# Patient Record
Sex: Female | Born: 1955 | Race: White | Hispanic: No | Marital: Married | Smoking: Never smoker
Health system: Southern US, Community
[De-identification: ages and names within clinical notes are randomized; demographics above are authoritative.]

## PROBLEM LIST (undated history)

## (undated) DIAGNOSIS — F419 Anxiety disorder, unspecified: Secondary | ICD-10-CM

## (undated) DIAGNOSIS — E039 Hypothyroidism, unspecified: Secondary | ICD-10-CM

## (undated) DIAGNOSIS — E538 Deficiency of other specified B group vitamins: Secondary | ICD-10-CM

## (undated) DIAGNOSIS — M810 Age-related osteoporosis without current pathological fracture: Secondary | ICD-10-CM

## (undated) DIAGNOSIS — Z8719 Personal history of other diseases of the digestive system: Secondary | ICD-10-CM

## (undated) DIAGNOSIS — M797 Fibromyalgia: Secondary | ICD-10-CM

## (undated) HISTORY — DX: Anxiety disorder, unspecified: F41.9

## (undated) HISTORY — DX: Hypothyroidism, unspecified: E03.9

## (undated) HISTORY — PX: CHOLECYSTECTOMY: SHX55

## (undated) HISTORY — DX: Age-related osteoporosis without current pathological fracture: M81.0

## (undated) HISTORY — DX: Personal history of other diseases of the digestive system: Z87.19

## (undated) HISTORY — DX: Fibromyalgia: M79.7

## (undated) HISTORY — PX: ABDOMINAL HYSTERECTOMY: SHX81

## (undated) HISTORY — DX: Deficiency of other specified B group vitamins: E53.8

## (undated) HISTORY — PX: TUBAL LIGATION: SHX77

---

## 2003-07-23 ENCOUNTER — Ambulatory Visit (HOSPITAL_COMMUNITY): Admission: RE | Admit: 2003-07-23 | Discharge: 2003-07-23 | Payer: Self-pay | Admitting: Family Medicine

## 2012-09-27 HISTORY — PX: COLONOSCOPY: SHX174

## 2014-03-22 ENCOUNTER — Other Ambulatory Visit (HOSPITAL_COMMUNITY): Payer: Self-pay | Admitting: "Endocrinology

## 2014-03-22 DIAGNOSIS — E041 Nontoxic single thyroid nodule: Secondary | ICD-10-CM

## 2014-03-25 ENCOUNTER — Ambulatory Visit (HOSPITAL_COMMUNITY)
Admission: RE | Admit: 2014-03-25 | Discharge: 2014-03-25 | Disposition: A | Discharge status: Home / Self Care | Payer: BC Managed Care – PPO | Source: Ambulatory Visit | Attending: "Endocrinology | Admitting: "Endocrinology

## 2014-03-25 DIAGNOSIS — E042 Nontoxic multinodular goiter: Secondary | ICD-10-CM | POA: Insufficient documentation

## 2014-03-25 DIAGNOSIS — E041 Nontoxic single thyroid nodule: Secondary | ICD-10-CM

## 2014-03-27 ENCOUNTER — Other Ambulatory Visit: Payer: Self-pay | Admitting: "Endocrinology

## 2014-03-27 DIAGNOSIS — E041 Nontoxic single thyroid nodule: Secondary | ICD-10-CM

## 2014-04-02 ENCOUNTER — Ambulatory Visit
Admission: RE | Admit: 2014-04-02 | Discharge: 2014-04-02 | Disposition: A | Discharge status: Home / Self Care | Payer: BC Managed Care – PPO | Source: Ambulatory Visit | Attending: "Endocrinology | Admitting: "Endocrinology

## 2014-04-02 ENCOUNTER — Other Ambulatory Visit (HOSPITAL_COMMUNITY)
Admission: RE | Admit: 2014-04-02 | Discharge: 2014-04-02 | Disposition: A | Discharge status: Home / Self Care | Payer: BC Managed Care – PPO | Source: Ambulatory Visit | Attending: Interventional Radiology | Admitting: Interventional Radiology

## 2014-04-02 DIAGNOSIS — E041 Nontoxic single thyroid nodule: Secondary | ICD-10-CM

## 2014-11-11 ENCOUNTER — Other Ambulatory Visit (HOSPITAL_COMMUNITY): Payer: Self-pay | Admitting: "Endocrinology

## 2014-11-11 DIAGNOSIS — E049 Nontoxic goiter, unspecified: Secondary | ICD-10-CM

## 2014-12-27 ENCOUNTER — Ambulatory Visit (HOSPITAL_COMMUNITY)
Admission: RE | Admit: 2014-12-27 | Discharge: 2014-12-27 | Disposition: A | Discharge status: Home / Self Care | Payer: BLUE CROSS/BLUE SHIELD | Source: Ambulatory Visit | Attending: "Endocrinology | Admitting: "Endocrinology

## 2014-12-27 DIAGNOSIS — E042 Nontoxic multinodular goiter: Secondary | ICD-10-CM | POA: Insufficient documentation

## 2014-12-27 DIAGNOSIS — E049 Nontoxic goiter, unspecified: Secondary | ICD-10-CM | POA: Diagnosis present

## 2015-07-09 ENCOUNTER — Encounter: Payer: Self-pay | Admitting: "Endocrinology

## 2015-07-09 ENCOUNTER — Ambulatory Visit (INDEPENDENT_AMBULATORY_CARE_PROVIDER_SITE_OTHER): Payer: BLUE CROSS/BLUE SHIELD | Admitting: "Endocrinology

## 2015-07-09 VITALS — BP 134/85 | HR 75 | Ht 63.0 in | Wt 142.0 lb

## 2015-07-09 DIAGNOSIS — E039 Hypothyroidism, unspecified: Secondary | ICD-10-CM

## 2015-07-09 DIAGNOSIS — E042 Nontoxic multinodular goiter: Secondary | ICD-10-CM | POA: Diagnosis not present

## 2015-07-09 MED ORDER — LEVOTHYROXINE SODIUM 100 MCG PO TABS: 100.0000 ug | ORAL_TABLET | Freq: Every day | ORAL | Status: AC

## 2015-07-09 NOTE — Progress Notes (Signed)
HPI  Emily Hood is a 59 y.o.-year-old female, Presenting for f/u of  Hypothyroidism and MNG.  Pt. has been dx with hypothyroidism is on Levothyroxine 100 mcg, taken: - fasting - with water - separated by >30 min from b'fast  - no calcium, iron, PPIs, multivitamins   I reviewed pt's thyroid tests: From 06/27/2015 free T4 1 0.6 TSH 0.7.  Pt lost 6 lbs, denies cold  intolerance, constipation, dry skin.  Pt denies  hoarseness, dysphagia/odynophagia, SOB with lying down.   ROS: Constitutional: no weight gain/loss, no fatigue, no subjective hyperthermia/hypothermia Eyes: no blurry vision, no xerophthalmia ENT: no sore throat, no nodules palpated in throat, no dysphagia/odynophagia, no hoarseness Cardiovascular: no CP/SOB/palpitations/leg swelling Respiratory: no cough/SOB Gastrointestinal: no N/V/D/C Musculoskeletal: no muscle/joint aches Skin: no rashes Neurological: no tremors/numbness/tingling/dizziness Psychiatric: no depression/anxiety  PE: BP 134/85 mmHg  Pulse 75  Ht 5\' 3"  (1.6 m)  Wt 142 lb (64.411 kg)  BMI 25.16 kg/m2  SpO2 99% Wt Readings from Last 3 Encounters:  07/09/15 142 lb (64.411 kg)   Constitutional: overweight, in NAD Eyes: PERRLA, EOMI, no exophthalmos ENT: moist mucous membranes, mild  thyromegaly, no cervical lymphadenopathy Cardiovascular: RRR, No MRG Respiratory: CTA B Gastrointestinal: abdomen soft, NT, ND, BS+ Musculoskeletal: no deformities, strength intact in all 4 Skin: moist, warm, no rashes Neurological: no tremor with outstretched hands, DTR normal in all 4  ASSESSMENT: 1. Hypothyroidism 2. MNG  PLAN:    Patient with long-standing hypothyroidism, on levothyroxine therapy. Patient appears euthyroid. I advised her to continue levothyroxine 100 g by mouth every morning.. - We discussed about correct intake of levothyroxine, at fasting, with water, separated by at least 30 minutes from breakfast, and separated by more than 4 hours  from calcium, iron, multivitamins, acid reflux medications (PPIs). -Patient is made aware of the fact that thyroid hormone replacement is needed for life, dose to be adjusted by periodic monitoring of thyroid function tests.  - Will check thyroid tests before next visit: TSH, free T4   On physical exam , patient has  mild goiter.  Her FNA showed benign findings with follicular, Hurthle cells, and lymphocytes. She has biopsy of only the left lobe nodule. she has stable size of nodules. she will not not need surgery , now but may need repeat thyroid u/s in a year after a PE.  Marquis LunchGebre Kishawn Pickar, MD Phone: [phone]  Fax: [phone]   07/09/2015, 8:56 AM

## 2015-08-06 ENCOUNTER — Encounter: Payer: Self-pay | Admitting: "Endocrinology

## 2015-10-31 IMAGING — US US THYROID BIOPSY
1 series · 13 of 13 positions shown · non-contrast
Comparison: Prior thyroid ultrasound 03/25/2014

CLINICAL DATA: 58-year-old female with a dominant left thyroid
nodule which meets consensus criteria for FNA biopsy.

EXAM:
ULTRASOUND GUIDED NEEDLE ASPIRATE BIOPSY OF THE THYROID GLAND

[Series 1: us thyroid biopsy · 0.06mm/px · 13 acquisitions, 13 frames shown]
[im 1/13]
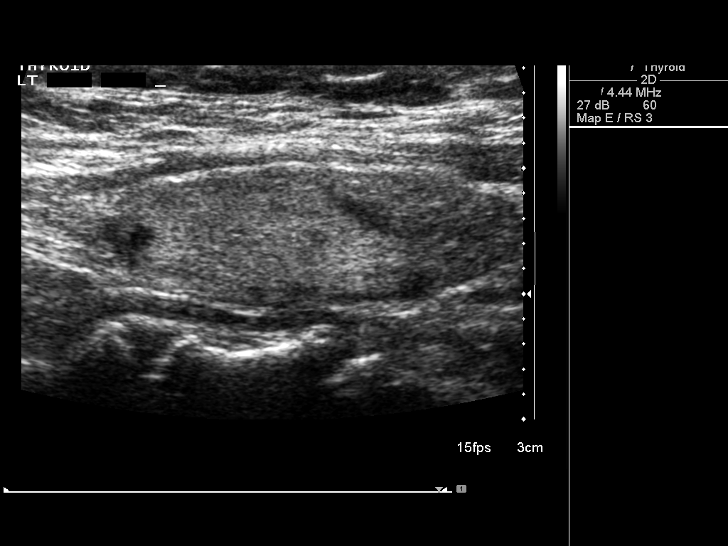
[im 2/13]
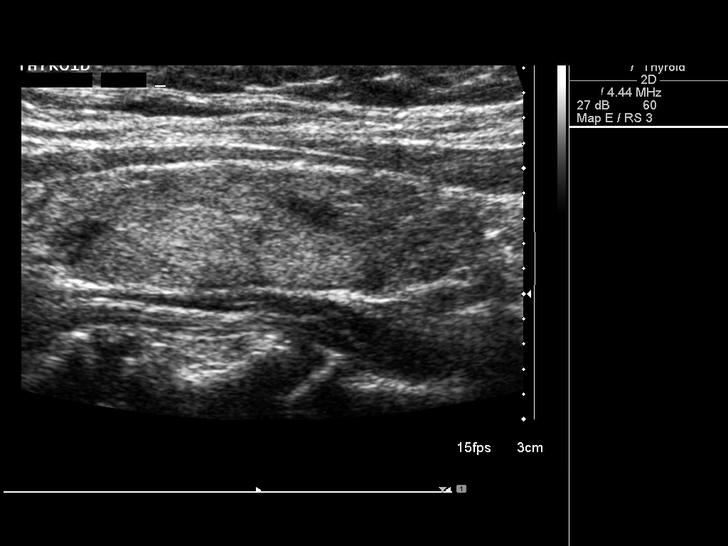
[im 3/13]
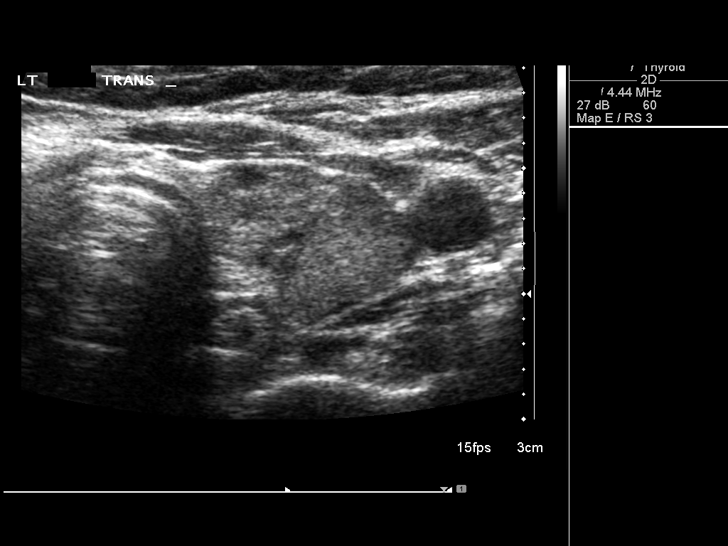
[im 4/13]
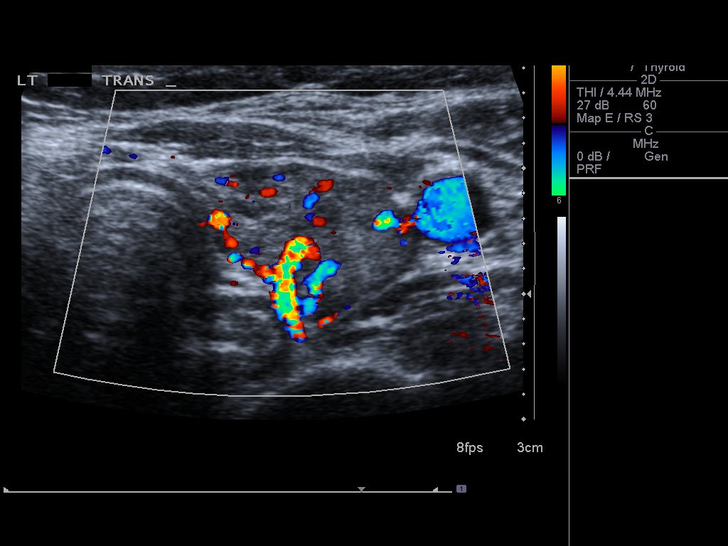
[im 5/13]
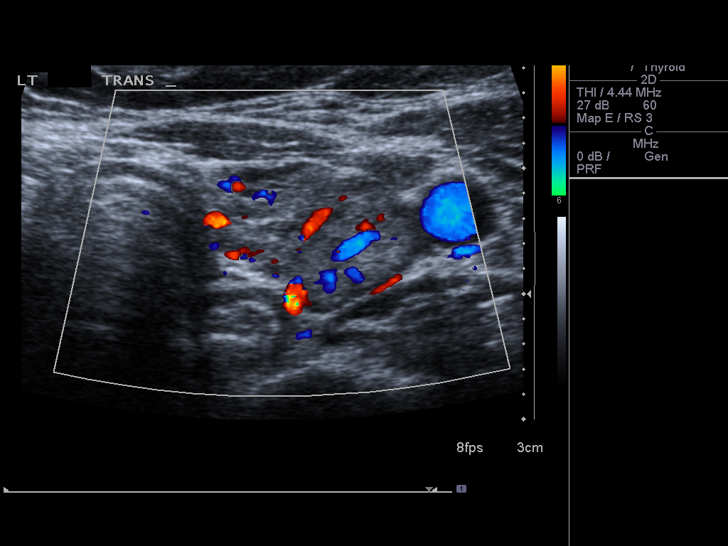
[im 6/13]
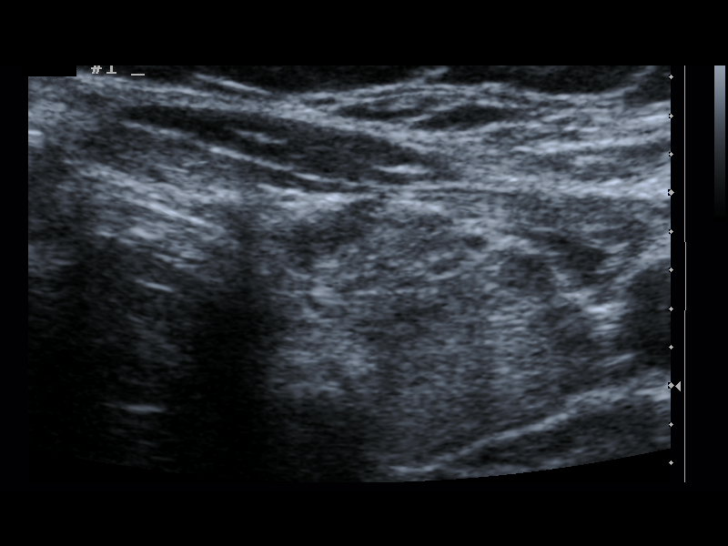
[im 7/13]
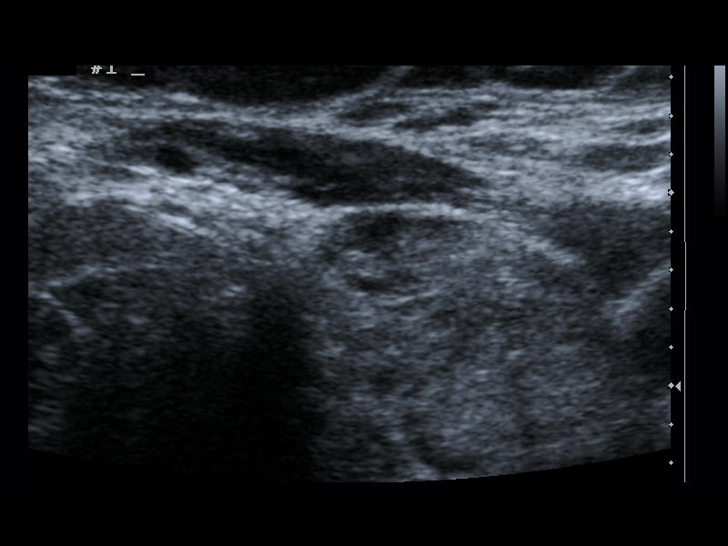
[im 8/13]
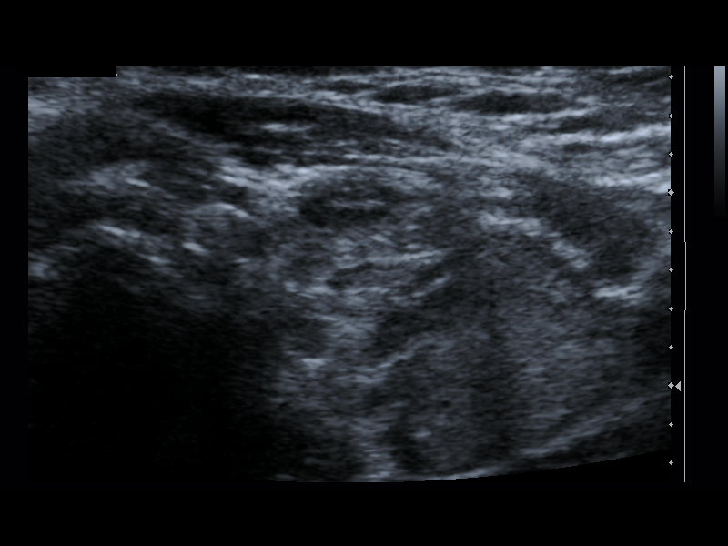
[im 9/13]
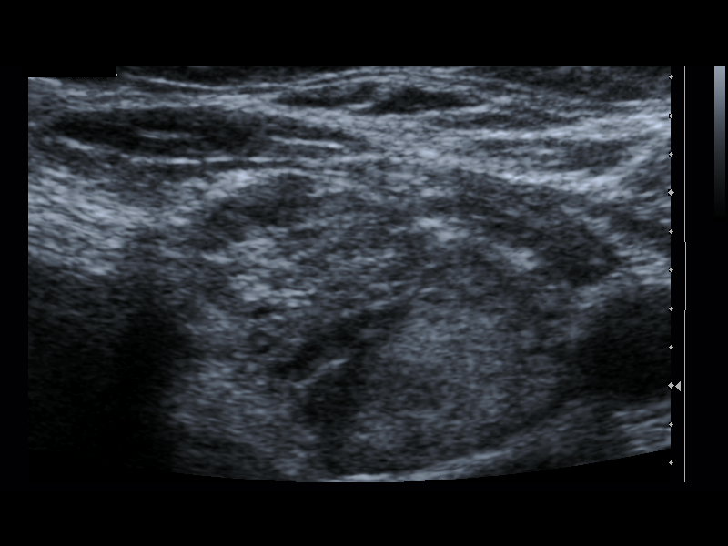
[im 10/13]
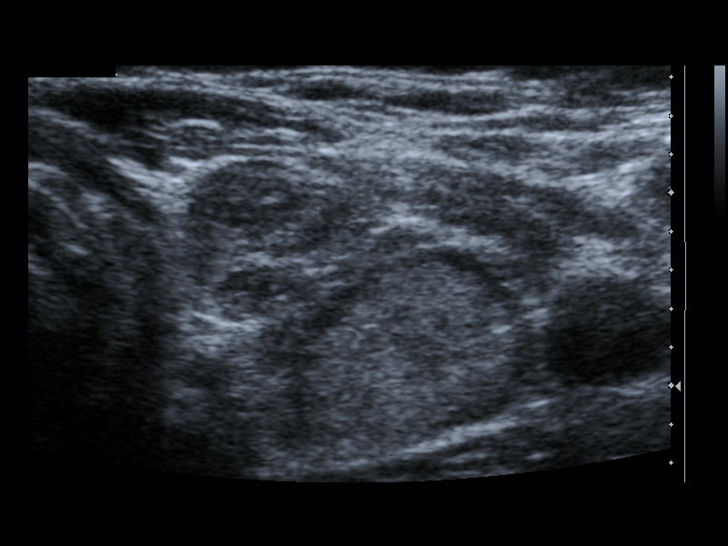
[im 11/13]
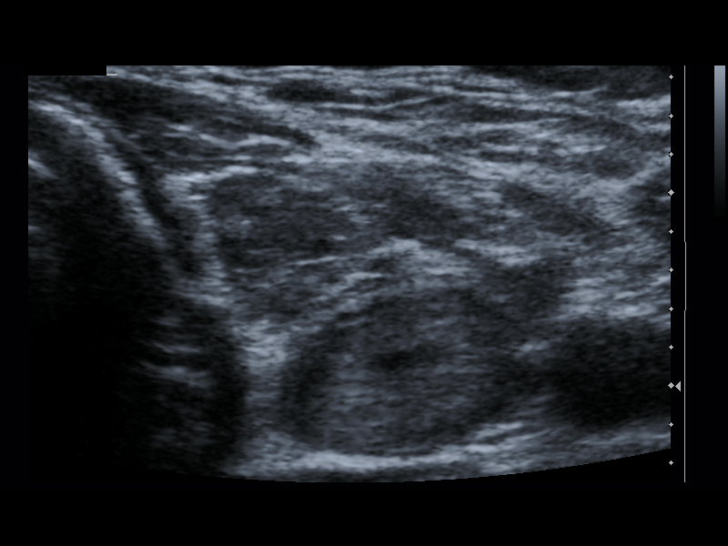
[im 12/13]
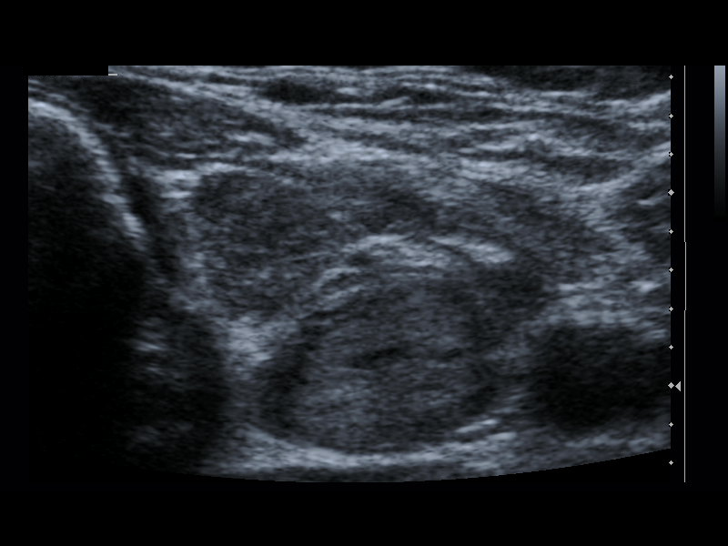
[im 13/13]
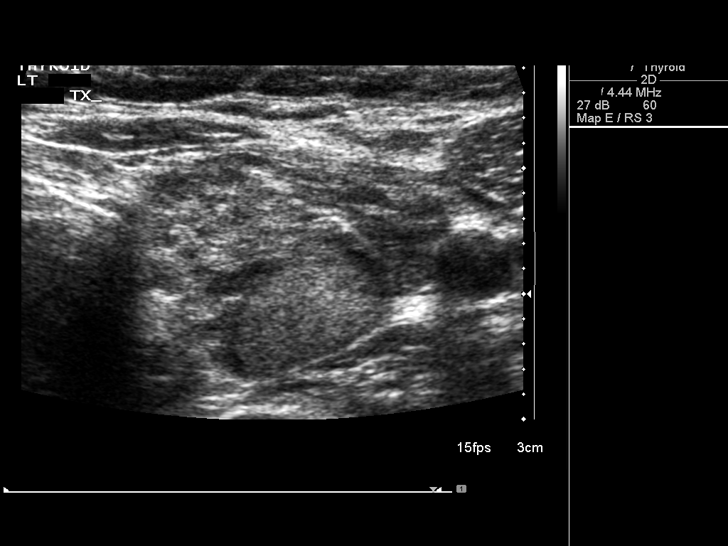

[13 of 13 positions shown; findings below may reference images not displayed]

PROCEDURE:
Thyroid biopsy was thoroughly discussed with the patient and
questions were answered. The benefits, risks, alternatives, and
complications were also discussed. The patient understands and
wishes to proceed with the procedure. Written consent was obtained.



Complications:  None
FINDINGS: Successful identification of dominant solid nodule in the posterior
aspect of the left thyroid gland.
IMPRESSION: Ultrasound guided needle aspirate biopsy performed of the left
thyroid nodule.

## 2016-04-13 ENCOUNTER — Encounter: Payer: Self-pay | Admitting: Internal Medicine

## 2016-05-14 ENCOUNTER — Ambulatory Visit: Payer: BLUE CROSS/BLUE SHIELD | Admitting: Gastroenterology

## 2016-05-25 ENCOUNTER — Encounter: Payer: Self-pay | Admitting: Gastroenterology

## 2016-05-25 ENCOUNTER — Ambulatory Visit (INDEPENDENT_AMBULATORY_CARE_PROVIDER_SITE_OTHER): Payer: BLUE CROSS/BLUE SHIELD | Admitting: Gastroenterology

## 2016-05-25 DIAGNOSIS — K5900 Constipation, unspecified: Secondary | ICD-10-CM | POA: Diagnosis not present

## 2016-05-25 DIAGNOSIS — K5730 Diverticulosis of large intestine without perforation or abscess without bleeding: Secondary | ICD-10-CM

## 2016-05-25 MED ORDER — LINACLOTIDE 290 MCG PO CAPS: 290.0000 ug | ORAL_CAPSULE | Freq: Every day | ORAL | 5 refills | Status: AC

## 2016-05-25 NOTE — Progress Notes (Signed)
Primary Care Physician:  Delorse LekBlair, Diane W, FNP  Primary Gastroenterologist:  Roetta SessionsMichael Rourk, MD   Chief Complaint  Patient presents with  . Constipation    HPI:  Emily Hood is a 60 y.o. female here At the request of Georgana Curioiane Blair, FNP for further evaluation of constipation. Patient reports history of chronic constipation. Has never been able to control this with her diet. She can go 3 or 4 days without a bowel movement. Typically has associated abdominal pain. She states she will typically get pain in the left lower quadrant. She has been treated on multiple occasions for suspected diverticulitis. Will take Cipro and Flagyl and Diflucan for antibiotic associated yeast infection. She has been treated for diverticulitis at least 3 times this year. She states often she gets better on the antibiotics but once discontinued her symptoms recur requires an additional round. She recalls being hospitalized on one occasion for diverticulitis. [facility] 2[facility]3 weeks ago. Patient tells me she has seen Dr. Elodia FlorenceFine in [place] most recently. Also has been seen by Dr. Aleene DavidsonSpainhour in [place] who is now retired.   Dr. Elodia FlorenceFine put her on Linzess which seemed to work for a while. She was on 145 g daily. Last couple months however she's had increasing difficulty with constipation, gas. She's had a lot of belching, bloating but no heartburn. She takes simethicone after breakfast. Xanax seems to calm her stomach. She denies blood in the stool or melena. States she always had some left lower quadrant pain but sometimes worse with meals, sometimes worse than at other times. Some relief with bowel movement.    Addendum: Records received since patient left the office. Outlined below and past surgical history updated.  CT abdomen pelvis without contrast 04/09/2016 diverticulosis of the colon with a short segment of narrowing and inflammation in the mid descending colon consistent with diverticulitis  or focal colitis. Cannot exclude possibility of associated neoplasm.  CT of abdomen and pelvis with contrast dated November 2015, 09/2013 were negative for acute diverticulitis.  CT scan pelvis with contrast June were 2014 with thick-walled colon from the splenic flexure to the distal sigmoid colon, slight amount of pericolonic inflammatory/infiltrative change suspicious for diffuse colitis versus IBD.  CT without and pelvis with and without contrast August 2012 consistent with extensive sigmoid diverticulosis with mild diverticulitis seen.   Current Outpatient Prescriptions  Medication Sig Dispense Refill  . ALPRAZolam (XANAX) 0.5 MG tablet Take 0.5 mg by mouth at bedtime as needed for anxiety.    Marland Kitchen. levothyroxine (SYNTHROID, LEVOTHROID) 100 MCG tablet Take 1 tablet (100 mcg total) by mouth daily before breakfast. 30 tablet 11  . Linaclotide (LINZESS) 145 MCG CAPS capsule Take 145 mcg by mouth daily.    Marland Kitchen. zolpidem (AMBIEN) 10 MG tablet Take 10 mg by mouth at bedtime as needed for sleep.     No current facility-administered medications for this visit.     Allergies as of 05/25/2016 - Review Complete 05/25/2016  Allergen Reaction Noted  . Codeine  07/09/2015  . Penicillins  07/09/2015    Past Medical History:  Diagnosis Date  . Anxiety   . B12 deficiency   . Fibromyalgia   . History of colonic diverticulitis    Multiple episodes  . Hypothyroidism   . Osteoporosis     Past Surgical History:  Procedure Laterality Date  . ABDOMINAL HYSTERECTOMY    . CHOLECYSTECTOMY    . COLONOSCOPY  2014   Dr. Aleene DavidsonSpainhour: Good prep.  Sigmoid diverticulosis. No narrowing of the lumen. Redundant descending colon. Repeat colonoscopy 10 years.  . TUBAL LIGATION      Family History  Problem Relation Age of Onset  . Thyroid disease Mother   . Hypertension Mother   . Breast cancer Mother   . Ovarian cancer Mother   . Thyroid disease Sister   . Thyroid disease Maternal Aunt   . Lung disease  Father     black lung  . Hepatitis Brother     in jail since 14, drug related, does things to get back in. suspected viral hepatitis from drugs.   . Colon cancer Paternal Aunt     78-70    Social History   Social History  . Marital status: Married    Spouse name: N/A  . Number of children: N/A  . Years of education: N/A   Occupational History  . Not on file.   Social History Main Topics  . Smoking status: Never Smoker  . Smokeless tobacco: Not on file  . Alcohol use No  . Drug use: No  . Sexual activity: Not on file   Other Topics Concern  . Not on file   Social History Narrative  . No narrative on file      ROS:  General: Negative for anorexia, weight loss, fever, chills, fatigue, weakness. Eyes: Negative for vision changes.  ENT: Negative for hoarseness, difficulty swallowing , nasal congestion. CV: Negative for chest pain, angina, palpitations, dyspnea on exertion, peripheral edema.  Respiratory: Negative for dyspnea at rest, dyspnea on exertion, cough, sputum, wheezing.  GI: See history of present illness. GU:  Negative for dysuria, hematuria, urinary incontinence, urinary frequency, nocturnal urination.  MS: Negative for joint pain, low back pain.  Derm: Negative for rash or itching.  Neuro: Negative for weakness, abnormal sensation, seizure, frequent headaches, memory loss, confusion.  Psych: Negative for anxiety, depression, suicidal ideation, hallucinations.  Endo: Negative for unusual weight change.  Heme: Negative for bruising or bleeding. Allergy: Negative for rash or hives.    Physical Examination:  BP 110/71   Pulse 95   Temp 98 F (36.7 C) (Oral)   Ht 5' 2.5" (1.588 m)   Wt 142 lb 12.8 oz (64.8 kg)   BMI 25.70 kg/m    General: Well-nourished, well-developed in no acute distress.  Head: Normocephalic, atraumatic.   Eyes: Conjunctiva pink, no icterus. Mouth: Oropharyngeal mucosa moist and pink , no lesions erythema or exudate. Neck:  Supple without thyromegaly, masses, or lymphadenopathy.  Lungs: Clear to auscultation bilaterally.  Heart: Regular rate and rhythm, no murmurs rubs or gallops.  Abdomen: Bowel sounds are normal, mild left lower quadrant tenderness, nondistended, no hepatosplenomegaly or masses, no abdominal bruits or    hernia , no rebound or guarding.   Rectal: Not performed Extremities: No lower extremity edema. No clubbing or deformities.  Neuro: Alert and oriented x 4 , grossly normal neurologically.  Skin: Warm and dry, no rash or jaundice.   Psych: Alert and cooperative, normal mood and affect.  Labs: Labs from May 2017 White blood cell count 5700, hemoglobin 13.1, hematocrit 41, platelets 193,000, MCV 82, BUN 8, creatinine 0.7, total bilirubin 0.4, alkaline phosphatase 99, AST 24, ALT 26, albumin 3.6, TSH 0.47, vitamin B12 205  Imaging Studies: No results found.

## 2016-05-25 NOTE — Patient Instructions (Signed)
1. Increase Linzess to daily before breakfast. RX sent to your pharmacy.  2. I will review your records once available and make further recommendations.    Diverticulosis Diverticulosis is the condition that develops when small pouches (diverticula) form in the wall of your colon. Your colon, or large intestine, is where water is absorbed and stool is formed. The pouches form when the inside layer of your colon pushes through weak spots in the outer layers of your colon. CAUSES  No one knows exactly what causes diverticulosis. RISK FACTORS  Being older than 50. Your risk for this condition increases with age. Diverticulosis is rare in people younger than 40 years. By age 43, almost everyone has it.  Eating a low-fiber diet.  Being frequently constipated.  Being overweight.  Not getting enough exercise.  Smoking.  Taking over-the-counter pain medicines, like aspirin and ibuprofen. SYMPTOMS  Most people with diverticulosis do not have symptoms. DIAGNOSIS  Because diverticulosis often has no symptoms, health care providers often discover the condition during an exam for other colon problems. In many cases, a health care provider will diagnose diverticulosis while using a flexible scope to examine the colon (colonoscopy). TREATMENT  If you have never developed an infection related to diverticulosis, you may not need treatment. If you have had an infection before, treatment may include:  Eating more fruits, vegetables, and grains.  Taking a fiber supplement.  Taking a live bacteria supplement (probiotic).  Taking medicine to relax your colon. HOME CARE INSTRUCTIONS   Drink at least 6-8 glasses of water each day to prevent constipation.  Try not to strain when you have a bowel movement.  Keep all follow-up appointments. If you have had an infection before:  Increase the fiber in your diet as directed by your health care provider or dietitian.  Take a dietary fiber  supplement if your health care provider approves.  Only take medicines as directed by your health care provider. SEEK MEDICAL CARE IF:   You have abdominal pain.  You have bloating.  You have cramps.  You have not gone to the bathroom in 3 days. SEEK IMMEDIATE MEDICAL CARE IF:   Your pain gets worse.  Yourbloating becomes very bad.  You have a fever or chills, and your symptoms suddenly get worse.  You begin vomiting.  You have bowel movements that are bloody or black. MAKE SURE YOU:  Understand these instructions.  Will watch your condition.  Will get help right away if you are not doing well or get worse.   This information is not intended to replace advice given to you by your health care provider. Make sure you discuss any questions you have with your health care provider.   Document Released: 06/10/2004 Document Revised: 09/18/2013 Document Reviewed: 08/08/2013 Elsevier Interactive Patient Education 2016 Elsevier Inc.  High-Fiber Diet Fiber, also called dietary fiber, is a type of carbohydrate found in fruits, vegetables, whole grains, and beans. A high-fiber diet can have many health benefits. Your health care provider may recommend a high-fiber diet to help:  Prevent constipation. Fiber can make your bowel movements more regular.  Lower your cholesterol.  Relieve hemorrhoids, uncomplicated diverticulosis, or irritable bowel syndrome.  Prevent overeating as part of a weight-loss plan.  Prevent heart disease, type 2 diabetes, and certain cancers. WHAT IS MY PLAN? The recommended daily intake of fiber includes:  38 grams for men under age 76.  30 grams for men over age 99.  25 grams for women under age  50.  21 grams for women over age 62. You can get the recommended daily intake of dietary fiber by eating a variety of fruits, vegetables, grains, and beans. Your health care provider may also recommend a fiber supplement if it is not possible to get  enough fiber through your diet. WHAT DO I NEED TO KNOW ABOUT A HIGH-FIBER DIET?  Fiber supplements have not been widely studied for their effectiveness, so it is better to get fiber through food sources.  Always check the fiber content on thenutrition facts label of any prepackaged food. Look for foods that contain at least 5 grams of fiber per serving.  Ask your dietitian if you have questions about specific foods that are related to your condition, especially if those foods are not listed in the following section.  Increase your daily fiber consumption gradually. Increasing your intake of dietary fiber too quickly may cause bloating, cramping, or gas.  Drink plenty of water. Water helps you to digest fiber. WHAT FOODS CAN I EAT? Grains Whole-grain breads. Multigrain cereal. Oats and oatmeal. Brown rice. Barley. Bulgur wheat. Millet. Bran muffins. Popcorn. Rye wafer crackers. Vegetables Sweet potatoes. Spinach. Kale. Artichokes. Cabbage. Broccoli. Green peas. Carrots. Squash. Fruits Berries. Pears. Apples. Oranges. Avocados. Prunes and raisins. Dried figs. Meats and Other Protein Sources Navy, kidney, pinto, and soy beans. Split peas. Lentils. Nuts and seeds. Dairy Fiber-fortified yogurt. Beverages Fiber-fortified soy milk. Fiber-fortified orange juice. Other Fiber bars. The items listed above may not be a complete list of recommended foods or beverages. Contact your dietitian for more options. WHAT FOODS ARE NOT RECOMMENDED? Grains White bread. Pasta made with refined flour. White rice. Vegetables Fried potatoes. Canned vegetables. Well-cooked vegetables.  Fruits Fruit juice. Cooked, strained fruit. Meats and Other Protein Sources Fatty cuts of meat. Fried Environmental education officerpoultry or fried fish. Dairy Milk. Yogurt. Cream cheese. Sour cream. Beverages Soft drinks. Other Cakes and pastries. Butter and oils. The items listed above may not be a complete list of foods and beverages to avoid.  Contact your dietitian for more information. WHAT ARE SOME TIPS FOR INCLUDING HIGH-FIBER FOODS IN MY DIET?  Eat a wide variety of high-fiber foods.  Make sure that half of all grains consumed each day are whole grains.  Replace breads and cereals made from refined flour or white flour with whole-grain breads and cereals.  Replace white rice with brown rice, bulgur wheat, or millet.  Start the day with a breakfast that is high in fiber, such as a cereal that contains at least 5 grams of fiber per serving.  Use beans in place of meat in soups, salads, or pasta.  Eat high-fiber snacks, such as berries, raw vegetables, nuts, or popcorn.   This information is not intended to replace advice given to you by your health care provider. Make sure you discuss any questions you have with your health care provider.   Document Released: 09/13/2005 Document Revised: 10/04/2014 Document Reviewed: 02/26/2014 Elsevier Interactive Patient Education [employer] Elsevier Inc.

## 2016-05-27 ENCOUNTER — Encounter: Payer: Self-pay | Admitting: Gastroenterology

## 2016-05-27 ENCOUNTER — Telehealth: Payer: Self-pay

## 2016-05-27 MED ORDER — FLUCONAZOLE 150 MG PO TABS: ORAL_TABLET | ORAL | 0 refills | Status: DC

## 2016-05-27 MED ORDER — CIPROFLOXACIN HCL 500 MG PO TABS: 500.0000 mg | ORAL_TABLET | Freq: Two times a day (BID) | ORAL | 0 refills | Status: DC

## 2016-05-27 MED ORDER — METRONIDAZOLE 500 MG PO TABS: 500.0000 mg | ORAL_TABLET | Freq: Three times a day (TID) | ORAL | 0 refills | Status: DC

## 2016-05-27 NOTE — Assessment & Plan Note (Addendum)
60 year old female with worsening constipation, well documented recurrent diverticulitis. Last CT in July showed short segment of narrowing and inflammation in the mid descending colon consistent with diverticulitis or focal colitis. Patient reports getting better with antibiotic therapy but off medication, symptoms tend to return. She is not having adequate management of her constipation this time. CT from July 2017, radiologist recommended colonoscopy to exclude associated neoplasm in the sigmoid colon area. Her last colonoscopy was 3 years ago.   Would recommend colonoscopy with Dr. Darrick PennaFields. We have started Linzess 290 g daily to assist with constipation. We'll plan on colonoscopy in approximately 3 weeks, will provide antibiotic coverage for potential persistent diverticulitis given ongoing abdominal pain.  I have discussed the risks, alternatives, benefits with regards to but not limited to the risk of reaction to medication, bleeding, infection, perforation and the patient is agreeable to proceed. Written consent to be obtained.  Discussed with patient, she may require elective partial colectomy for treatment of her recurrent diverticulitis.

## 2016-05-27 NOTE — Telephone Encounter (Signed)
Noted  

## 2016-05-27 NOTE — Telephone Encounter (Signed)
Called and informed pt that CT in July 2017 showed diverticulitis and [facility] had sent Cipro, Flagyl, and Diflucan in to pharmacy. Pt stated she had a hard time keeping Cipro and Flagyl down before but she would try it. Advised we would call her back to set up a colonoscopy that [facility] wants done in 3 weeks.

## 2016-05-27 NOTE — Progress Notes (Addendum)
Please let patient know that I have reviewed all of her records. She has well-documented diverticulitis on multiple occasions via CT scan. She had suspected diverticulitis on CT scan in July 2017. It is recommended that she undergo colonoscopy to exclude any underlying strictures or masses. Her last colonoscopy was 3 years ago.  At this time would recommend 7 additional days of Cipro and Flagyl as patient was having ongoing abdominal pain. Also take Diflucan on day 1 and repeat in one week as patient has a tendency towards vaginal yeast infections on antibiotics.  All prescriptions were are descended to the pharmacy.  PLEASE SCHEDULE FOR COLONOSCOPY WITH DR. Jena GaussOURK IN 3 WEEKS.

## 2016-05-28 NOTE — Progress Notes (Signed)
CC'ED TO PCP 

## 2016-06-01 ENCOUNTER — Other Ambulatory Visit: Payer: Self-pay

## 2016-06-01 DIAGNOSIS — K5730 Diverticulosis of large intestine without perforation or abscess without bleeding: Secondary | ICD-10-CM

## 2016-06-01 MED ORDER — PEG-KCL-NACL-NASULF-NA ASC-C 100 G PO SOLR: 1.0000 | ORAL | 0 refills | Status: DC

## 2016-06-01 NOTE — Progress Notes (Signed)
Scheduled TCS for 06/10/16. Instructions mailed.

## 2016-06-10 ENCOUNTER — Encounter (HOSPITAL_COMMUNITY)
Admission: RE | Disposition: A | Discharge status: Home / Self Care | Payer: Self-pay | Source: Ambulatory Visit | Attending: Internal Medicine

## 2016-06-10 ENCOUNTER — Encounter (HOSPITAL_COMMUNITY): Payer: Self-pay | Admitting: *Deleted

## 2016-06-10 ENCOUNTER — Ambulatory Visit (HOSPITAL_COMMUNITY)
Admission: RE | Admit: 2016-06-10 | Discharge: 2016-06-10 | Disposition: A | Discharge status: Home / Self Care | Payer: BLUE CROSS/BLUE SHIELD | Source: Ambulatory Visit | Attending: Internal Medicine | Admitting: Internal Medicine

## 2016-06-10 DIAGNOSIS — K5909 Other constipation: Secondary | ICD-10-CM | POA: Insufficient documentation

## 2016-06-10 DIAGNOSIS — R933 Abnormal findings on diagnostic imaging of other parts of digestive tract: Secondary | ICD-10-CM | POA: Insufficient documentation

## 2016-06-10 DIAGNOSIS — F419 Anxiety disorder, unspecified: Secondary | ICD-10-CM | POA: Diagnosis not present

## 2016-06-10 DIAGNOSIS — E039 Hypothyroidism, unspecified: Secondary | ICD-10-CM | POA: Diagnosis not present

## 2016-06-10 DIAGNOSIS — K5730 Diverticulosis of large intestine without perforation or abscess without bleeding: Secondary | ICD-10-CM | POA: Diagnosis not present

## 2016-06-10 HISTORY — PX: COLONOSCOPY: SHX5424

## 2016-06-10 SURGERY — COLONOSCOPY
Anesthesia: Moderate Sedation

## 2016-06-10 MED ORDER — MIDAZOLAM HCL 5 MG/5ML IJ SOLN
INTRAMUSCULAR | Status: DC | PRN
  Administered 2016-06-10 (×2): 1 mg via INTRAVENOUS
  Administered 2016-06-10 (×2): 2 mg via INTRAVENOUS

## 2016-06-10 MED ORDER — MIDAZOLAM HCL 5 MG/5ML IJ SOLN
INTRAMUSCULAR | Status: AC
  Filled 2016-06-10: qty 10

## 2016-06-10 MED ORDER — PROMETHAZINE HCL 25 MG/ML IJ SOLN
12.5000 mg | Freq: Once | INTRAMUSCULAR | Status: AC
  Administered 2016-06-10: 12.5 mg via INTRAVENOUS

## 2016-06-10 MED ORDER — SIMETHICONE 40 MG/0.6ML PO SUSP
ORAL | Status: DC | PRN
  Administered 2016-06-10: 2.5 mL

## 2016-06-10 MED ORDER — ONDANSETRON HCL 4 MG/2ML IJ SOLN
INTRAMUSCULAR | Status: AC
  Filled 2016-06-10: qty 2

## 2016-06-10 MED ORDER — PROMETHAZINE HCL 25 MG/ML IJ SOLN
INTRAMUSCULAR | Status: AC
  Filled 2016-06-10: qty 1

## 2016-06-10 MED ORDER — MEPERIDINE HCL 100 MG/ML IJ SOLN
INTRAMUSCULAR | Status: AC
  Filled 2016-06-10: qty 2

## 2016-06-10 MED ORDER — ONDANSETRON HCL 4 MG/2ML IJ SOLN
INTRAMUSCULAR | Status: DC | PRN
Start: 2016-06-10 — End: 2016-06-10
  Administered 2016-06-10: 4 mg via INTRAVENOUS

## 2016-06-10 MED ORDER — SODIUM CHLORIDE 0.9% FLUSH
INTRAVENOUS | Status: AC
  Filled 2016-06-10: qty 10

## 2016-06-10 MED ORDER — MEPERIDINE HCL 100 MG/ML IJ SOLN
INTRAMUSCULAR | Status: DC | PRN
  Administered 2016-06-10 (×2): 25 mg via INTRAVENOUS
  Administered 2016-06-10: 50 mg via INTRAVENOUS

## 2016-06-10 MED ORDER — SODIUM CHLORIDE 0.9 % IV SOLN
INTRAVENOUS | Status: DC
  Administered 2016-06-10: 12:00:00 via INTRAVENOUS

## 2016-06-10 NOTE — Discharge Instructions (Addendum)
Colonoscopy Discharge Instructions  Read the instructions outlined below and refer to this sheet in the next few weeks. These discharge instructions provide you with general information on caring for yourself after you leave the hospital. Your doctor may also give you specific instructions. While your treatment has been planned according to the most current medical practices available, unavoidable complications occasionally occur. If you have any problems or questions after discharge, call Dr. Jena Gauss at [phone]. ACTIVITY  You may resume your regular activity, but move at a slower pace for the next 24 hours.   Take frequent rest periods for the next 24 hours.   Walking will help get rid of the air and reduce the bloated feeling in your belly (abdomen).   No driving for 24 hours (because of the medicine (anesthesia) used during the test).    Do not sign any important legal documents or operate any machinery for 24 hours (because of the anesthesia used during the test).  NUTRITION  Drink plenty of fluids.   You may resume your normal diet as instructed by your doctor.   Begin with a light meal and progress to your normal diet. Heavy or fried foods are harder to digest and may make you feel sick to your stomach (nauseated).   Avoid alcoholic beverages for 24 hours or as instructed.  MEDICATIONS  You may resume your normal medications unless your doctor tells you otherwise.  WHAT YOU CAN EXPECT TODAY  Some feelings of bloating in the abdomen.   Passage of more gas than usual.   Spotting of blood in your stool or on the toilet paper.  IF YOU HAD POLYPS REMOVED DURING THE COLONOSCOPY:  No aspirin products for 7 days or as instructed.   No alcohol for 7 days or as instructed.   Eat a soft diet for the next 24 hours.  FINDING OUT THE RESULTS OF YOUR TEST Not all test results are available during your visit. If your test results are not back during the visit, make an appointment  with your caregiver to find out the results. Do not assume everything is normal if you have not heard from your caregiver or the medical facility. It is important for you to follow up on all of your test results.  SEEK IMMEDIATE MEDICAL ATTENTION IF:  You have more than a spotting of blood in your stool.   Your belly is swollen (abdominal distention).   You are nauseated or vomiting.   You have a temperature over 101.   You have abdominal pain or discomfort that is severe or gets worse throughout the day.     Diverticulosis and constipation information provided  Take Linzess 290 every day without fail.  Take Benefiber 1 tablespoon twice daily every day without fail  Take MiraLAX 17 g orally (1) at Bedtime Daily on any given day without a bowel movement  Repeat colonoscopy in 10 years for screening purposes  Office appointment with [place] in 8 weeks.    Colonoscopy, Care After These instructions give you information on caring for yourself after your procedure. Your doctor may also give you more specific instructions. Call your doctor if you have any problems or questions after your procedure. HOME CARE  Do not drive for 24 hours.  Do not sign important papers or use machinery for 24 hours.  You may shower.  You may go back to your usual activities, but go slower for the first 24 hours.  Take rest breaks often during the first  24 hours.  Walk around or use warm packs on your belly (abdomen) if you have belly cramping or gas.  Drink enough fluids to keep your pee (urine) clear or pale yellow.  Resume your normal diet. Avoid heavy or fried foods.  Avoid drinking alcohol for 24 hours or as told by your doctor.  Only take medicines as told by your doctor. If a tissue sample (biopsy) was taken during the procedure:   Do not take aspirin or blood thinners for 7 days, or as told by your doctor.  Do not drink alcohol for 7 days, or as told by your doctor.  Eat soft foods  for the first 24 hours. GET HELP IF: You still have a small amount of blood in your poop (stool) 2-3 days after the procedure. GET HELP RIGHT AWAY IF:  You have more than a small amount of blood in your poop.  You see clumps of tissue (blood clots) in your poop.  Your belly is puffy (swollen).  You feel sick to your stomach (nauseous) or throw up (vomit).  You have a fever.  You have belly pain that gets worse and medicine does not help. MAKE SURE YOU:  Understand these instructions.  Will watch your condition.  Will get help right away if you are not doing well or get worse.   This information is not intended to replace advice given to you by your health care provider. Make sure you discuss any questions you have with your health care provider.   Document Released: 10/16/2010 Document Revised: 09/18/2013 Document Reviewed: 05/21/2013 Elsevier Interactive Patient Education [employer]2016 Else[employer] Inc.   Diverticulosis Diverticulosis is the condition that develops when small pouches (diverticula) form in the wall of your colon. Your colon, or large intestine, is where water is absorbed and stool is formed. The pouches form when the inside layer of your colon pushes through weak spots in the outer layers of your colon. CAUSES  No one knows exactly what causes diverticulosis. RISK FACTORS  Being older than 50. Your risk for this condition increases with age. Diverticulosis is rare in people younger than 40 years. By age 62, almost everyone has it.  Eating a low-fiber diet.  Being frequently constipated.  Being overweight.  Not getting enough exercise.  Smoking.  Taking over-the-counter pain medicines, like aspirin and ibuprofen. SYMPTOMS  Most people with diverticulosis do not have symptoms. DIAGNOSIS  Because diverticulosis often has no symptoms, health care providers often discover the condition during an exam for other colon problems. In many cases, a health care provider  will diagnose diverticulosis while using a flexible scope to examine the colon (colonoscopy). TREATMENT  If you have never developed an infection related to diverticulosis, you may not need treatment. If you have had an infection before, treatment may include:  Eating more fruits, vegetables, and grains.  Taking a fiber supplement.  Taking a live bacteria supplement (probiotic).  Taking medicine to relax your colon. HOME CARE INSTRUCTIONS   Drink at least 6-8 glasses of water each day to prevent constipation.  Try not to strain when you have a bowel movement.  Keep all follow-up appointments. If you have had an infection before:  Increase the fiber in your diet as directed by your health care provider or dietitian.  Take a dietary fiber supplement if your health care provider approves.  Only take medicines as directed by your health care provider. SEEK MEDICAL CARE IF:   You have abdominal pain.  You have bloating.  You have cramps.  You have not gone to the bathroom in 3 days. SEEK IMMEDIATE MEDICAL CARE IF:   Your pain gets worse.  Yourbloating becomes very bad.  You have a fever or chills, and your symptoms suddenly get worse.  You begin vomiting.  You have bowel movements that are bloody or black. MAKE SURE YOU:  Understand these instructions.  Will watch your condition.  Will get help right away if you are not doing well or get worse.   This information is not intended to replace advice given to you by your health care provider. Make sure you discuss any questions you have with your health care provider.   Document Released: 06/10/2004 Document Revised: 09/18/2013 Document Reviewed: 08/08/2013 Elsevier Interactive Patient Education [employer]

## 2016-06-10 NOTE — H&P (View-Only) (Signed)
Primary Care Physician:  Delorse LekBlair, Diane W, FNP  Primary Gastroenterologist:  Roetta SessionsMichael Rourk, MD   Chief Complaint  Patient presents with  . Constipation    HPI:  Emily Hood is a 60 y.o. female here At the request of Georgana Curioiane Blair, FNP for further evaluation of constipation. Patient reports history of chronic constipation. Has never been able to control this with her diet. She can go 3 or 4 days without a bowel movement. Typically has associated abdominal pain. She states she will typically get pain in the left lower quadrant. She has been treated on multiple occasions for suspected diverticulitis. Will take Cipro and Flagyl and Diflucan for antibiotic associated yeast infection. She has been treated for diverticulitis at least 3 times this year. She states often she gets better on the antibiotics but once discontinued her symptoms recur requires an additional round. She recalls being hospitalized on one occasion for diverticulitis. [facility]3 weeks ago. Patient tells me she has seen Dr. Elodia FlorenceFine in [place] most recently. Also has been seen by Dr. Aleene DavidsonSpainhour in [place] who is now retired.   Dr. Elodia FlorenceFine put her on Linzess which seemed to work for a while. She was on 145 g daily. Last couple months however she's had increasing difficulty with constipation, gas. She's had a lot of belching, bloating but no heartburn. She takes simethicone after breakfast. Xanax seems to calm her stomach. She denies blood in the stool or melena. States she always had some left lower quadrant pain but sometimes worse with meals, sometimes worse than at other times. Some relief with bowel movement.    Addendum: Records received since patient left the office. Outlined below and past surgical history updated.  CT abdomen pelvis without contrast 04/09/2016 diverticulosis of the colon with a short segment of narrowing and inflammation in the mid descending colon consistent with diverticulitis  or focal colitis. Cannot exclude possibility of associated neoplasm.  CT of abdomen and pelvis with contrast dated November 2015, 09/2013 were negative for acute diverticulitis.  CT scan pelvis with contrast June were 2014 with thick-walled colon from the splenic flexure to the distal sigmoid colon, slight amount of pericolonic inflammatory/infiltrative change suspicious for diffuse colitis versus IBD.  CT without and pelvis with and without contrast August 2012 consistent with extensive sigmoid diverticulosis with mild diverticulitis seen.   Current Outpatient Prescriptions  Medication Sig Dispense Refill  . ALPRAZolam (XANAX) 0.5 MG tablet Take 0.5 mg by mouth at bedtime as needed for anxiety.    Marland Kitchen. levothyroxine (SYNTHROID, LEVOTHROID) 100 MCG tablet Take 1 tablet (100 mcg total) by mouth daily before breakfast. 30 tablet 11  . Linaclotide (LINZESS) 145 MCG CAPS capsule Take 145 mcg by mouth daily.    Marland Kitchen. zolpidem (AMBIEN) 10 MG tablet Take 10 mg by mouth at bedtime as needed for sleep.     No current facility-administered medications for this visit.     Allergies as of 05/25/2016 - Review Complete 05/25/2016  Allergen Reaction Noted  . Codeine  07/09/2015  . Penicillins  07/09/2015    Past Medical History:  Diagnosis Date  . Anxiety   . B12 deficiency   . Fibromyalgia   . History of colonic diverticulitis    Multiple episodes  . Hypothyroidism   . Osteoporosis     Past Surgical History:  Procedure Laterality Date  . ABDOMINAL HYSTERECTOMY    . CHOLECYSTECTOMY    . COLONOSCOPY  2014   Dr. Aleene DavidsonSpainhour: Good prep.  Sigmoid diverticulosis. No narrowing of the lumen. Redundant descending colon. Repeat colonoscopy 10 years.  . TUBAL LIGATION      Family History  Problem Relation Age of Onset  . Thyroid disease Mother   . Hypertension Mother   . Breast cancer Mother   . Ovarian cancer Mother   . Thyroid disease Sister   . Thyroid disease Maternal Aunt   . Lung disease  Father     black lung  . Hepatitis Brother     in jail since 14, drug related, does things to get back in. suspected viral hepatitis from drugs.   . Colon cancer Paternal Aunt     78-70    Social History   Social History  . Marital status: Married    Spouse name: N/A  . Number of children: N/A  . Years of education: N/A   Occupational History  . Not on file.   Social History Main Topics  . Smoking status: Never Smoker  . Smokeless tobacco: Not on file  . Alcohol use No  . Drug use: No  . Sexual activity: Not on file   Other Topics Concern  . Not on file   Social History Narrative  . No narrative on file      ROS:  General: Negative for anorexia, weight loss, fever, chills, fatigue, weakness. Eyes: Negative for vision changes.  ENT: Negative for hoarseness, difficulty swallowing , nasal congestion. CV: Negative for chest pain, angina, palpitations, dyspnea on exertion, peripheral edema.  Respiratory: Negative for dyspnea at rest, dyspnea on exertion, cough, sputum, wheezing.  GI: See history of present illness. GU:  Negative for dysuria, hematuria, urinary incontinence, urinary frequency, nocturnal urination.  MS: Negative for joint pain, low back pain.  Derm: Negative for rash or itching.  Neuro: Negative for weakness, abnormal sensation, seizure, frequent headaches, memory loss, confusion.  Psych: Negative for anxiety, depression, suicidal ideation, hallucinations.  Endo: Negative for unusual weight change.  Heme: Negative for bruising or bleeding. Allergy: Negative for rash or hives.    Physical Examination:  BP 110/71   Pulse 95   Temp 98 F (36.7 C) (Oral)   Ht 5' 2.5" (1.588 m)   Wt 142 lb 12.8 oz (64.8 kg)   BMI 25.70 kg/m    General: Well-nourished, well-developed in no acute distress.  Head: Normocephalic, atraumatic.   Eyes: Conjunctiva pink, no icterus. Mouth: Oropharyngeal mucosa moist and pink , no lesions erythema or exudate. Neck:  Supple without thyromegaly, masses, or lymphadenopathy.  Lungs: Clear to auscultation bilaterally.  Heart: Regular rate and rhythm, no murmurs rubs or gallops.  Abdomen: Bowel sounds are normal, mild left lower quadrant tenderness, nondistended, no hepatosplenomegaly or masses, no abdominal bruits or    hernia , no rebound or guarding.   Rectal: Not performed Extremities: No lower extremity edema. No clubbing or deformities.  Neuro: Alert and oriented x 4 , grossly normal neurologically.  Skin: Warm and dry, no rash or jaundice.   Psych: Alert and cooperative, normal mood and affect.  Labs: Labs from May 2017 White blood cell count 5700, hemoglobin 13.1, hematocrit 41, platelets 193,000, MCV 82, BUN 8, creatinine 0.7, total bilirubin 0.4, alkaline phosphatase 99, AST 24, ALT 26, albumin 3.6, TSH 0.47, vitamin B12 205  Imaging Studies: No results found.

## 2016-06-10 NOTE — Op Note (Signed)
[facility] Patient Name: Emily Hood Procedure Date: 06/10/2016 12:32 PM MRN: [id number] Date of Birth: 06-Apr-1956 Attending MD: Gennette Pac , MD CSN: [id number] Age: 60 Admit Type: Outpatient Procedure:                Colonoscopy - diagnostic Indications:              Abnormal CT of the GI tract Providers:                Gennette Pac, MD, Jannett Celestine, RN, Birder Robson, Technician Referring MD:              Medicines:                Meperidine 100 mg IV, Midazolam 6 mg IV,                            Promethazine 12.5 mg IV Complications:            No immediate complications. Estimated Blood Loss:     Estimated blood loss: none. Procedure:                Pre-Anesthesia Assessment:                           - Prior to the procedure, a History and Physical                            was performed, and patient medications and                            allergies were reviewed. The patient's tolerance of                            previous anesthesia was also reviewed. The risks                            and benefits of the procedure and the sedation                            options and risks were discussed with the patient.                            All questions were answered, and informed consent                            was obtained. Prior Anticoagulants: The patient has                            taken no previous anticoagulant or antiplatelet                            agents. ASA Grade Assessment: II - A patient with  mild systemic disease. After reviewing the risks                            and benefits, the patient was deemed in                            satisfactory condition to undergo the procedure.                           After obtaining informed consent, the colonoscope                            was passed under direct vision. Throughout the                            procedure, the  patient's blood pressure, pulse, and                            oxygen saturations were monitored continuously. The                            EC-[id number] ([id number] scope was introduced through                            the anus and advanced to the the cecum, identified                            by appendiceal orifice and ileocecal valve. The                            EC-[id number] ([id number] scope was introduced through                            the and advanced to the. The colonoscopy was                            somewhat difficult due to restricted mobility of                            the colon. Successful completion of the procedure                            was aided by using manual pressure. The patient                            tolerated the procedure well. The quality of the                            bowel preparation was adequate. The ileocecal                            valve, appendiceal orifice, and rectum were  photographed. Scope In: 12:43:57 PM Scope Out: 1:06:12 PM Scope Withdrawal Time: 0 hours 11 minutes 36 seconds  Total Procedure Duration: 0 hours 22 minutes 15 seconds  Findings:      The perianal and digital rectal examinations were normal.      Multiple small and large-mouthed diverticula were found in the sigmoid       colon and descending colon. There was narrowing of the colon in       association with the diverticular opening in the proximal sigmoid       segment. In fact, this narrowing would not admit the adult colonoscope.       I withdrew and obtained a pediatric colonoscope. With this instrument, I       was able to advance proximally all the way to the cecum. Somewhat       redundant colon beyond the descending segment. The patient had extensive       left-sided diverticula. No polyp or neoplastic process observed.       Narrowing of the sigmoid segment likely scarring from repeated bouts of       diverticulitis. Estimated  blood loss: none. .      The exam was otherwise without abnormality on direct and retroflexion       views. Impression:               - Moderate diverticulosis in the sigmoid colon and                            in the descending colon. There was narrowing of the                            colon in association with the diverticular opening.                           - The examination was otherwise normal on direct                            and retroflexion views.                           - No specimens collected. Moderate Sedation:      Moderate (conscious) sedation was administered by the endoscopy nurse       and supervised by the endoscopist. The following parameters were       monitored: oxygen saturation, heart rate, blood pressure, respiratory       rate, EKG, adequacy of pulmonary ventilation, and response to care.       Total physician intraservice time was 29 minutes. Recommendation:           - Patient has a contact number available for                            emergencies. The signs and symptoms of potential                            delayed complications were discussed with the                            patient. Return  to normal activities tomorrow.                            Written discharge instructions were provided to the                            patient.                           - Advance diet as tolerated.                           - Continue present medications. Continued Linzess 1                            tablespoon twice daily without fail. MiraLAX 17 g                            orally necessary constipation. If future                            diverticulitis, would consider a left hemicolectomy.                           - Repeat colonoscopy in 10 years for screening                            purposes.                           - Return to GI office in 8 weeks. Procedure Code(s):        --- Professional ---                           [id number],  Colonoscopy, flexible; diagnostic, including                            collection of specimen(s) by brushing or washing,                            when performed (separate procedure)                           99152, Moderate sedation services provided by the                            same physician or other qualified health care                            professional performing the diagnostic or                            therapeutic service that the sedation supports,                            requiring the presence of an independent  trained                            observer to assist in the monitoring of the                            patient's level of consciousness and physiological                            status; initial 15 minutes of intraservice time,                            patient age 8 years or older                           [phone], Moderate sedation services; each additional                            15 minutes intraservice time Diagnosis Code(s):        --- Professional ---                           K57.30, Diverticulosis of large intestine without                            perforation or abscess without bleeding                           R93.3, Abnormal findings on diagnostic imaging of                            other parts of digestive tract CPT copyright 2016 American Medical Association. All rights reserved. The codes documented in this report are preliminary and upon coder review may  be revised to meet current compliance requirements. Gerrit Friends. Tywanna Seifer, MD Gennette Pac, MD 06/10/2016 1:21:12 PM This report has been signed electronically. Number of Addenda: 0

## 2016-06-10 NOTE — Interval H&P Note (Signed)
History and Physical Interval Note:  06/10/2016 12:35 PM  Emily Hood  has presented today for surgery, with the diagnosis of Diverticulosis  The various methods of treatment have been discussed with the patient and family. After consideration of risks, benefits and other options for treatment, the patient has consented to  Procedure(s) with comments: COLONOSCOPY (N/A) - 12:45 PM as a surgical intervention .  The patient's history has been reviewed, patient examined, no change in status, stable for surgery.  I have reviewed the patient's chart and labs.  Questions were answered to the patient's satisfaction.     No change:  Diagnostic colonoscopy per plan.  The risks, benefits, limitations, alternatives and imponderables have been reviewed with the patient. Questions have been answered. All parties are agreeable.   Eula Listenobert Danae Oland

## 2016-06-21 ENCOUNTER — Encounter (HOSPITAL_COMMUNITY): Payer: Self-pay | Admitting: Internal Medicine

## 2016-07-08 ENCOUNTER — Ambulatory Visit: Payer: BLUE CROSS/BLUE SHIELD | Admitting: "Endocrinology

## 2016-07-20 ENCOUNTER — Ambulatory Visit (INDEPENDENT_AMBULATORY_CARE_PROVIDER_SITE_OTHER): Payer: BLUE CROSS/BLUE SHIELD | Admitting: "Endocrinology

## 2016-07-20 ENCOUNTER — Encounter: Payer: Self-pay | Admitting: "Endocrinology

## 2016-07-20 ENCOUNTER — Encounter: Payer: Self-pay | Admitting: Gastroenterology

## 2016-07-20 ENCOUNTER — Ambulatory Visit (INDEPENDENT_AMBULATORY_CARE_PROVIDER_SITE_OTHER): Payer: BLUE CROSS/BLUE SHIELD | Admitting: Gastroenterology

## 2016-07-20 VITALS — BP 146/88 | HR 70 | Ht 62.5 in | Wt 144.0 lb

## 2016-07-20 VITALS — BP 125/72 | HR 67 | Temp 97.5°F | Ht 63.0 in | Wt 142.8 lb

## 2016-07-20 DIAGNOSIS — E039 Hypothyroidism, unspecified: Secondary | ICD-10-CM

## 2016-07-20 DIAGNOSIS — I10 Essential (primary) hypertension: Secondary | ICD-10-CM | POA: Insufficient documentation

## 2016-07-20 DIAGNOSIS — E042 Nontoxic multinodular goiter: Secondary | ICD-10-CM

## 2016-07-20 DIAGNOSIS — R1030 Lower abdominal pain, unspecified: Secondary | ICD-10-CM | POA: Diagnosis not present

## 2016-07-20 NOTE — Patient Instructions (Addendum)
Today: take 1 capful of Miralax each hour followed by a full glass of water (8 ounces). You may repeat up to 6 doses or until you start having good results.   Tomorrow: you will do the CT scan. Let's see what the results show before starting the antibiotics.   I have given you samples of Amitiza to take instead of Linzess. Make sure you take this twice a day with food (to avoid nausea). Let me know how this does for you.

## 2016-07-20 NOTE — Progress Notes (Signed)
Referring Provider: Delorse LekBlair, Diane W, FNP Primary Care Physician:  Delorse LekBlair, Diane W, FNP  Primary GI: Dr. Jena Gaussourk   Chief Complaint  Patient presents with  . Abdominal Pain    since had TCS, getting worse, across lower abd area, hurts around in back. Worse when eat solid food  . Constipation    small amt soft stool everyday    HPI:   Emily Hood is a 60 y.o. female presenting today with a history of multiple episodes of diverticulitis on multiple occasions via CT scan. Suspected diverticulitis with last episode in July 2017. Recently underwent colonoscopy in Sept 2017 with moderate diverticulosis in the sigmoid colon and in the descending colon. There was narrowing of the colon in association with the diverticular opening.   Notes lower back pain since colonoscopy. Thought at first she had problems with her back, then thought she had a kidney infection. Has seen PCP and does not have a kidney infection. Started on prednisone (on day 4 today). Eased off when first taking it but as she is tapering down, it is coming back harder. Now having lower abdominal discomfort as well, starting this morning. Takes Linzess 290 mcg each morning and Miralax at night (takes Miralax if no BM that day). BMs are small. Sometimes will have 2-3 BMs but it is never productive and always small amount. States when she had an xray on her back, she was told she may be stopped up. Hasn't checked her temperature but felt a little sickly a few days ago; however, this coincided with getting the flu shot. Afebrile today.   Past Medical History:  Diagnosis Date  . Anxiety   . B12 deficiency   . Fibromyalgia   . History of colonic diverticulitis    Multiple episodes  . Hypothyroidism   . Osteoporosis     Past Surgical History:  Procedure Laterality Date  . ABDOMINAL HYSTERECTOMY    . CHOLECYSTECTOMY    . COLONOSCOPY  2014   Dr. Aleene DavidsonSpainhour: Good prep. Sigmoid diverticulosis. No narrowing of the lumen. Redundant  descending colon. Repeat colonoscopy 10 years.  . COLONOSCOPY N/A 06/10/2016   Dr. Jena Gaussourk: moderate diverticulosis in sigmoid and in the descending colon, narrowing of colon in association with the diveritcular opening. Repeat in 10 years.   . TUBAL LIGATION      Current Outpatient Prescriptions  Medication Sig Dispense Refill  . ALPRAZolam (XANAX) 0.5 MG tablet Take 0.5 mg by mouth at bedtime as needed for anxiety.    Marland Kitchen. levothyroxine (SYNTHROID, LEVOTHROID) 100 MCG tablet Take 1 tablet (100 mcg total) by mouth daily before breakfast. 30 tablet 11  . linaclotide (LINZESS) 290 MCG CAPS capsule Take 1 capsule (290 mcg total) by mouth daily before breakfast. 30 capsule 5  . Polyethylene Glycol 3350 (MIRALAX PO) Take by mouth daily. Takes 17 grams daily    . predniSONE (DELTASONE) 5 MG tablet Take 5 mg by mouth daily with breakfast. Dose pack. Ends 07/21/16    . zolpidem (AMBIEN) 10 MG tablet Take 10 mg by mouth at bedtime as needed for sleep.     No current facility-administered medications for this visit.     Allergies as of 07/20/2016 - Review Complete 07/20/2016  Allergen Reaction Noted  . Codeine  07/09/2015  . Penicillins  07/09/2015  . Sulfa antibiotics Nausea And Vomiting 07/20/2016    Family History  Problem Relation Age of Onset  . Thyroid disease Mother   . Hypertension Mother   .  Breast cancer Mother   . Ovarian cancer Mother   . Thyroid disease Sister   . Thyroid disease Maternal Aunt   . Lung disease Father     black lung  . Hepatitis Brother     in jail since 14, drug related, does things to get back in. suspected viral hepatitis from drugs.   . Colon cancer Paternal Aunt     70-70    Social History   Social History  . Marital status: Married    Spouse name: N/A  . Number of children: N/A  . Years of education: N/A   Social History Main Topics  . Smoking status: Never Smoker  . Smokeless tobacco: Never Used  . Alcohol use No  . Drug use: No  . Sexual  activity: Not Asked   Other Topics Concern  . None   Social History Narrative  . None    Review of Systems: Negative unless mentioned in HPI.   Physical Exam: BP 125/72   Pulse 67   Temp 97.5 F (36.4 C) (Oral)   Ht 5\' 3"  (1.6 m)   Wt 142 lb 12.8 oz (64.8 kg)   BMI 25.30 kg/m  General:   Alert and oriented. No distress noted. Pleasant and cooperative.  Head:  Normocephalic and atraumatic. Eyes:  Conjuctiva clear without scleral icterus. Mouth:  Oral mucosa pink and moist. Good dentition. No lesions. Abdomen:  +BS, soft, TTP LLQ and non-distended. No rebound or guarding. No HSM or masses noted. Msk:  Symmetrical without gross deformities. Normal posture. Extremities:  Without edema. Neurologic:  Alert and  oriented x4;  grossly normal neurologically. Psych:  Alert and cooperative. Normal mood and affect.  Outside records:   CT abdomen pelvis without contrast 04/09/2016 diverticulosis of the colon with a short segment of narrowing and inflammation in the mid descending colon consistent with diverticulitis or focal colitis. Cannot exclude possibility of associated neoplasm.  CT of abdomen and pelvis with contrast dated November 2015, 09/2013 were negative for acute diverticulitis.  CT scan pelvis with contrast June were 2014 with thick-walled colon from the splenic flexure to the distal sigmoid colon, slight amount of pericolonic inflammatory/infiltrative change suspicious for diffuse colitis versus IBD.  CT without and pelvis with and without contrast August 2012 consistent with extensive sigmoid diverticulosis with mild diverticulitis seen.

## 2016-07-20 NOTE — Progress Notes (Signed)
HPI  Emily Hood is a 60 y.o.-year-old female, Presenting for f/u of  Hypothyroidism and MNG.  Pt. has been dx with hypothyroidism is on Levothyroxine 100 mcg, taken: - fasting - with water - separated by >30 min from b'fast  - no calcium, iron, PPIs, multivitamins  - Her most recent thyroid function tests from 07/06/2016 shows that she is being replaced appropriately. Her free T4 is 1.47 associated with TSH of 0.16.  Pt lost 6 lbs, denies cold  intolerance, constipation, dry skin.  Pt denies  hoarseness, dysphagia/odynophagia, SOB with lying down.   ROS: Constitutional: no weight gain/loss, no fatigue, no subjective hyperthermia/hypothermia Eyes: no blurry vision, no xerophthalmia ENT: no sore throat, no nodules palpated in throat, no dysphagia/odynophagia, no hoarseness Cardiovascular: no CP/SOB/palpitations/leg swelling Respiratory: no cough/SOB Gastrointestinal: no N/V/D/C Musculoskeletal: no muscle/joint aches Skin: no rashes Neurological: no tremors/numbness/tingling/dizziness Psychiatric: no depression/anxiety  PE: BP (!) 146/88   Pulse 70   Ht 5' 2.5" (1.588 m)   Wt 144 lb (65.3 kg)   BMI 25.92 kg/m  Wt Readings from Last 3 Encounters:  07/20/16 144 lb (65.3 kg)  06/10/16 142 lb (64.4 kg)  05/25/16 142 lb 12.8 oz (64.8 kg)   Constitutional: overweight, in NAD Eyes: PERRLA, EOMI, no exophthalmos ENT: moist mucous membranes, mild  thyromegaly, no cervical lymphadenopathy Cardiovascular: RRR, No MRG Respiratory: CTA B Gastrointestinal: abdomen soft, NT, ND, BS+ Musculoskeletal: no deformities, strength intact in all 4 Skin: moist, warm, no rashes Neurological: no tremor with outstretched hands, DTR normal in all 4  ASSESSMENT: 1. Hypothyroidism 2. MNG  PLAN:    Patient with long-standing hypothyroidism, on levothyroxine therapy. Her free T4 is 1.47, TSH still lacking at 0.16. - She was still benefit from levothyroxine at current dose. I advised her  to continue levothyroxine 100 g by mouth every morning. - We discussed about correct intake of levothyroxine, at fasting, with water, separated by at least 30 minutes from breakfast, and separated by more than 4 hours from calcium, iron, multivitamins, acid reflux medications (PPIs). -Patient is made aware of the fact that thyroid hormone replacement is needed for life, dose to be adjusted by periodic monitoring of thyroid function tests.    On physical exam , patient has  mild goiter.  Her  Prior FNA showed benign findings with follicular, Hurthle cells, and lymphocytes. She has biopsy of only the left lobe nodule. she has had stable size of nodules. She will  need repeat thyroid/ neck  u/s before her next visit.  - Her blood pressure was found to be above target this morning at 146/88 mmHg. she does not have priorof hypertension. I would not initiate any antihypertensive medications today.  I have advised her to have repeat blood pressure checked at her primary care doctor's office at least 2 weeks from now.  Marquis LunchGebre Takeya Marquis, MD Phone: [phone]  Fax: [phone]   07/20/2016, 10:57 AM

## 2016-07-21 DIAGNOSIS — R1030 Lower abdominal pain, unspecified: Secondary | ICD-10-CM | POA: Insufficient documentation

## 2016-07-21 NOTE — Progress Notes (Signed)
cc'ed to pcp °

## 2016-07-21 NOTE — Assessment & Plan Note (Signed)
60 year old female with history of multiple prior episodes of diverticulitis, presenting with recurrent abdominal pain and concerning for recurrent diverticulitis. Also dealing with constipation, but I feel that this is only playing a small role in the acuity of her symptoms. Will proceed with CT in [place] at patient's request. For constipation relief: Miralax purge prescribed, then start Amitiza 24 mcg po BID, samples provided. Further recommendations to follow.

## 2016-07-22 ENCOUNTER — Telehealth: Payer: Self-pay

## 2016-07-22 NOTE — Telephone Encounter (Signed)
Pre-authorization #: [id number] valid from Oct 26 through Nov 24th.

## 2016-07-22 NOTE — Telephone Encounter (Signed)
Pt is set up for CT on 07/26/16 @ 7:45. She is aware of appointment

## 2016-07-22 NOTE — Telephone Encounter (Signed)
Called insurance to do PA for CT scan. There are wanting provider to call for peer-to-peer. The number to call is [phone] they are going to asks for her ID #- [id number] and DOB-05/24/1956. [place] [address] and ICD 10 code- R10.30. She is going to have this done at [facility]. Please call

## 2016-07-23 ENCOUNTER — Telehealth: Payer: Self-pay

## 2016-07-23 DIAGNOSIS — R1032 Left lower quadrant pain: Secondary | ICD-10-CM

## 2016-07-23 MED ORDER — FLUCONAZOLE 150 MG PO TABS: 150.0000 mg | ORAL_TABLET | Freq: Every day | ORAL | 0 refills | Status: AC

## 2016-07-23 NOTE — Telephone Encounter (Signed)
As CT was delayed due to insurance issues, I contacted patient to get an update.  Abdominal discomfort only slightly improved with bowel regimen. Still with LLQ discomfort. Will treat empirically for diverticulitis while waiting on CT. Start Cipro 500 mg po BID and Flagyl 500 mg po TID. Patient already has prescriptions at home. I also sent in Diflucan as she is prone to yeast infections. Will await CT on Monday.

## 2016-07-23 NOTE — Telephone Encounter (Signed)
I put the order in. Also, when I spoke to the insurance company yesterday, I let them know that it was for abd/pelvis. They added the pelvis to it and the approval number is for abd/pelvis.   Gelene MinkAnna W. Contrell Ballentine, ANP-BC [facility]

## 2016-07-23 NOTE — Telephone Encounter (Signed)
Order faxed to [facility] and included PA#. Also called and informed Paige at [facility].

## 2016-07-23 NOTE — Telephone Encounter (Signed)
Yes. That is correct, needs CT abd/pelvis. Thanks! I am not sure how to put it in since it is an external order.

## 2016-07-23 NOTE — Telephone Encounter (Signed)
[place] called about CT ABD order for lower abd pain. Stated pt would need a CT ABD/PELVIS w/contrast to evaluate lower abd area. Forwarding to AB for clarification.

## 2016-07-26 ENCOUNTER — Encounter: Payer: Self-pay | Admitting: Internal Medicine

## 2016-07-26 IMAGING — US US SOFT TISSUE HEAD/NECK
1 series · 14 of 25 positions shown · non-contrast
Comparison: 03/25/2014

CLINICAL DATA: Followup goiter

EXAM:
THYROID ULTRASOUND
TECHNIQUE: Ultrasound examination of the thyroid gland and adjacent soft
tissues was performed.

[Series 1: us soft tissue head/neck · 0.04mm/px · 14 of 40 slices shown]
[im 1/40]
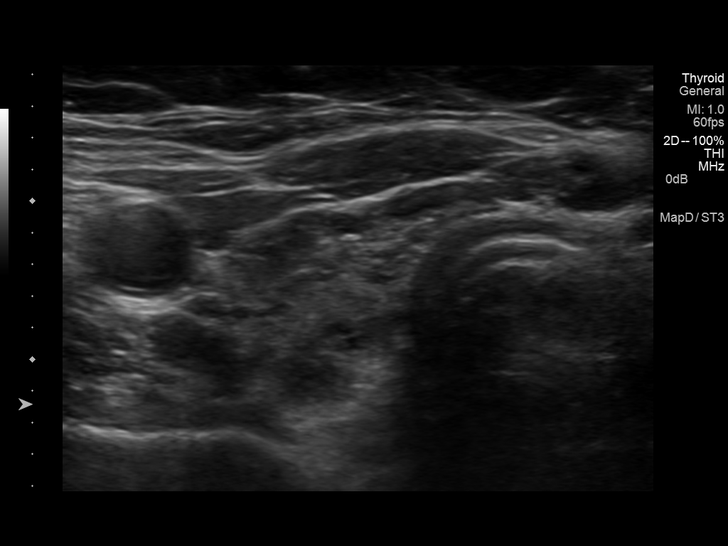
[im 4/40]
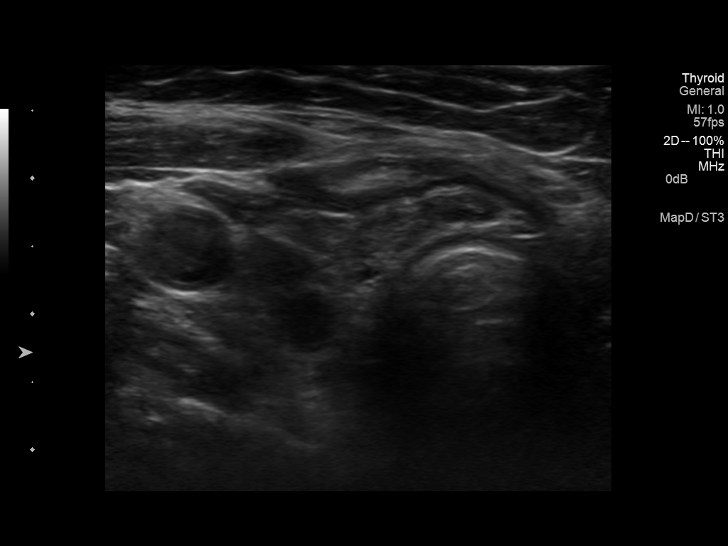
[im 7/40]
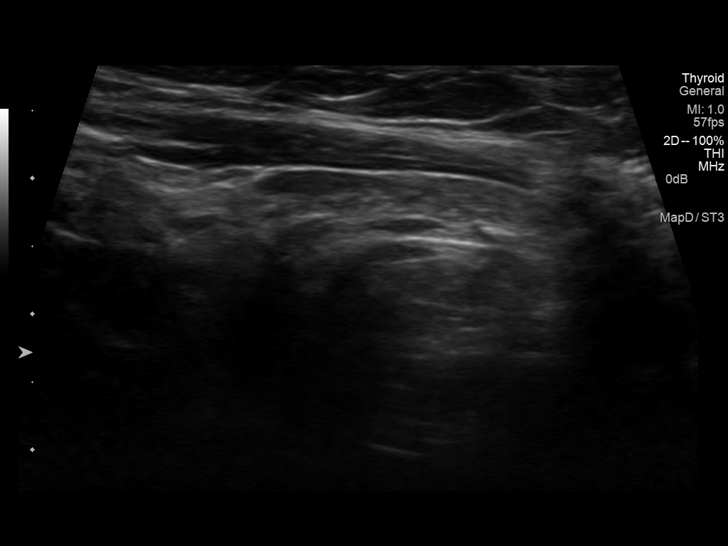
[im 10/40]
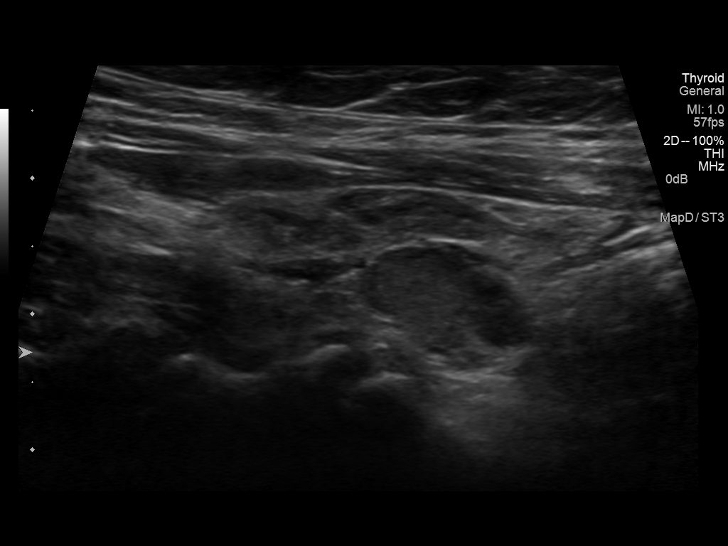
[im 14/40]
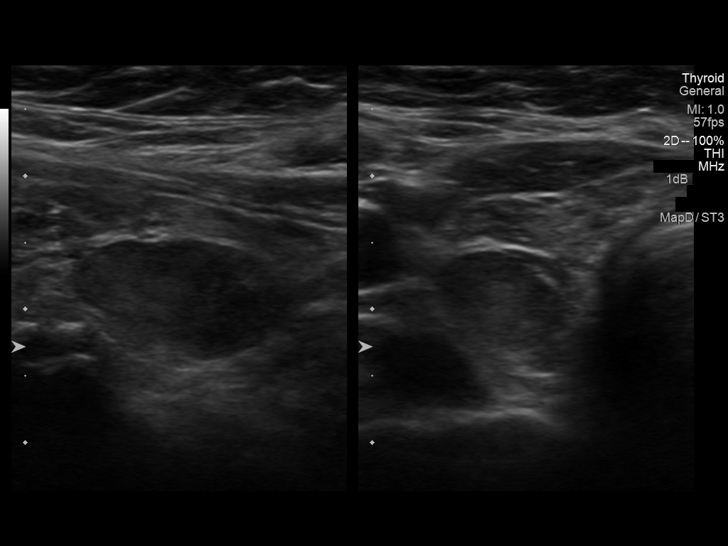
[im 15/40]
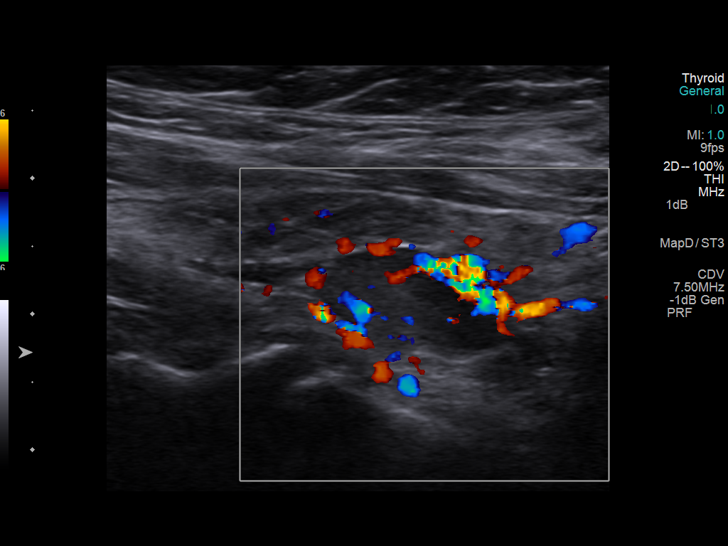
[im 18/40]
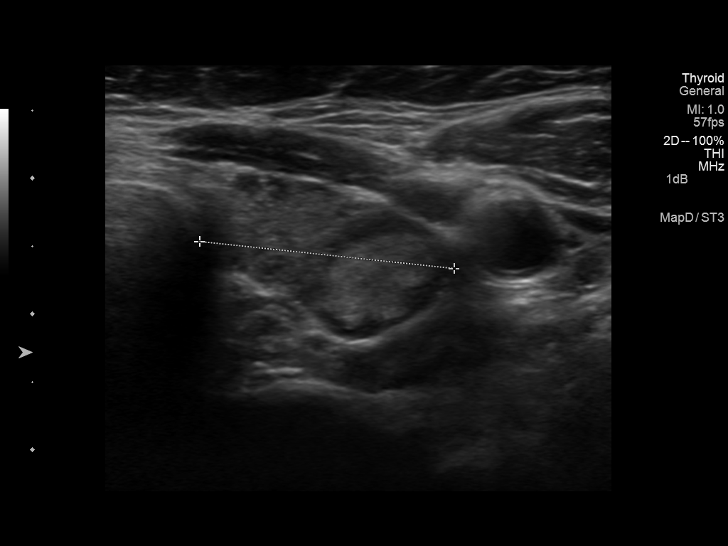
[im 22/40]
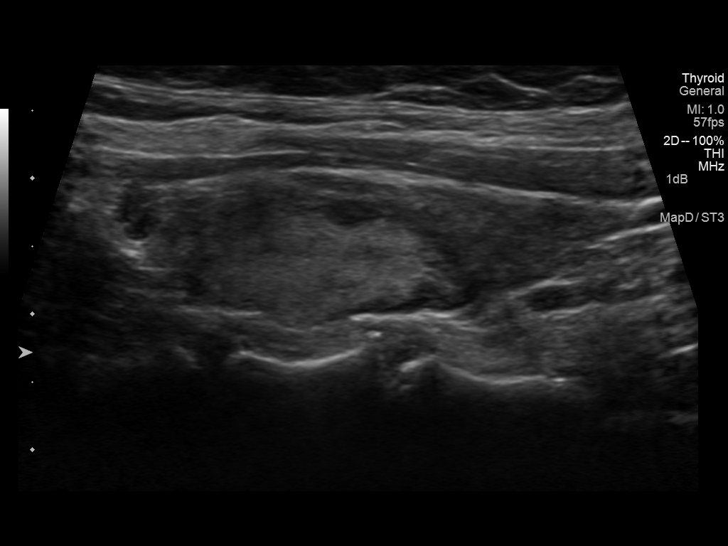
[im 25/40]
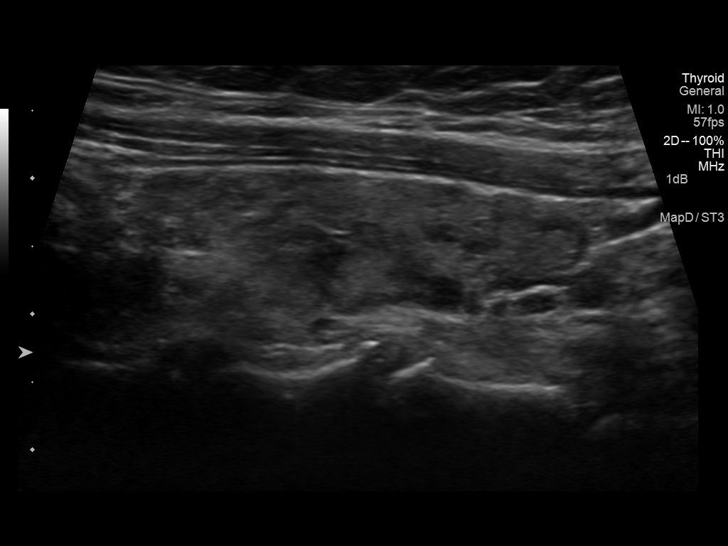
[im 27/40]
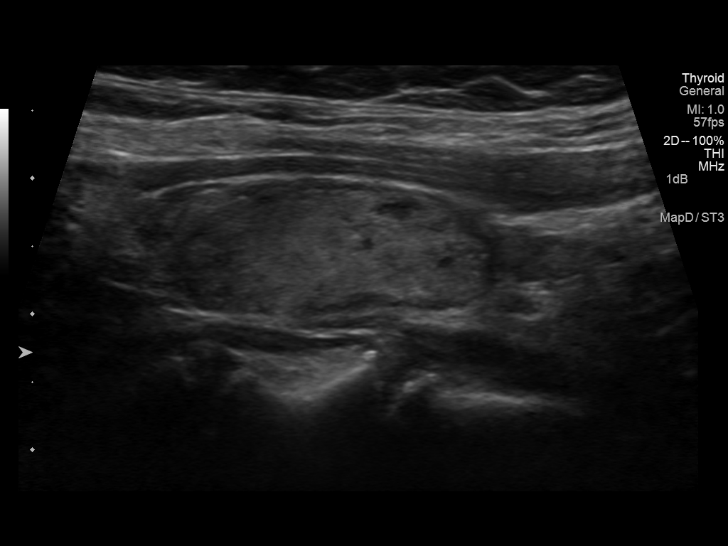
[im 30/40]
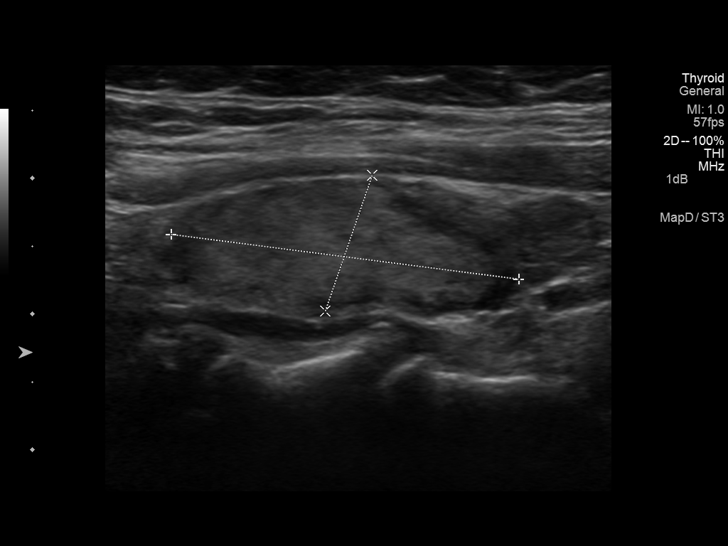
[im 33/40]
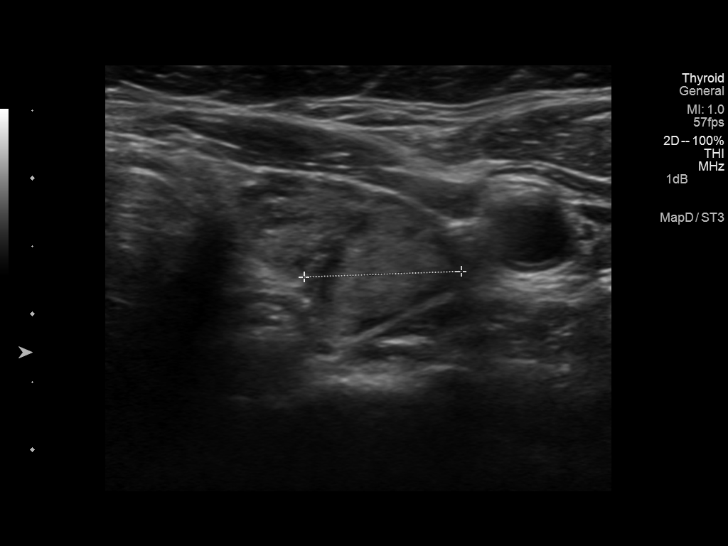
[im 36/40]
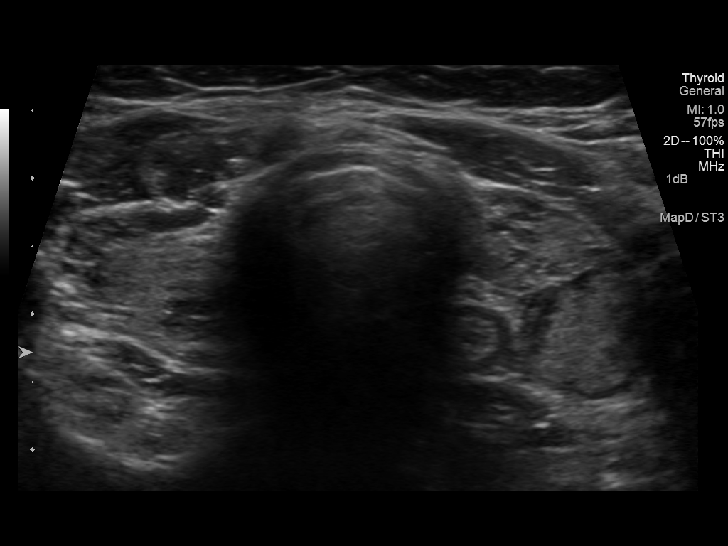
[im 40/40]
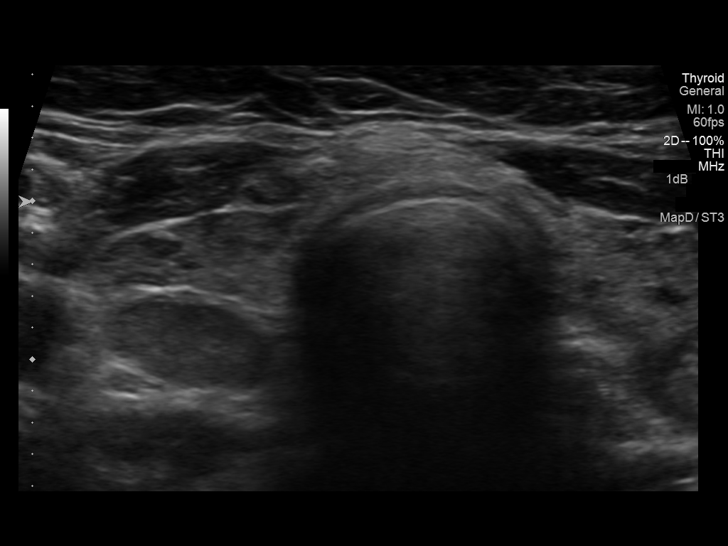

[14 of 25 positions shown; findings below may reference images not displayed]

FINDINGS: Right thyroid lobe

Measurements: 4.0 x 1.3 x 1.3 cm. Heterogeneous glandular tissue.
1.6 x 1.0 x 1.0 cm hypoechoic nodule posterior to the lower pole of
the right lobe of the thyroid gland previously measured 1.3 x 1.6 x
0.4 cm.

Left thyroid lobe

Measurements: 3.8 x 1.1 x 1.9 cm. Mid nodule measures 2.6 x 1.1 x
1.2 cm and previously measured 2.5 x 1.2 x 1.3 cm.

Isthmus

Thickness: 2 mm.  No nodules visualized.

Lymphadenopathy

None visualized.
IMPRESSION: Stable dominant left lobe nodule

Enlarging nodule posterior to the lower pole of the right lobe which
may represent a parathyroid entity.

## 2016-07-28 MED ORDER — MOXIFLOXACIN HCL 400 MG PO TABS: ORAL_TABLET | ORAL | 0 refills | Status: AC

## 2016-07-28 NOTE — Telephone Encounter (Signed)
Received CT. Sigmoid diverticulitis noted, no complicating features.   Patient has not been able to tolerate Cipro/Flagyl. Likely Flagyl causing vomiting. She is declining to take this. Unable to take Augmentin either. Allergic to Sulfa. Limited options for antibiotic therapy. Will need to treat with moxifloxacin 400 mg po every 24 hours for 7 days.   Please refer to general surgeon in [place] for consideration of elective colectomy due to persistent/recurrent diverticulitis.

## 2016-07-28 NOTE — Addendum Note (Signed)
Addended by: Gelene MinkBOONE, ANNA W on: 07/28/2016 11:17 AM   Modules accepted: Orders

## 2016-07-29 ENCOUNTER — Telehealth: Payer: Self-pay

## 2016-07-29 ENCOUNTER — Other Ambulatory Visit: Payer: Self-pay

## 2016-07-29 DIAGNOSIS — K5732 Diverticulitis of large intestine without perforation or abscess without bleeding: Secondary | ICD-10-CM

## 2016-07-29 NOTE — Telephone Encounter (Signed)
Noted  

## 2016-07-29 NOTE — Telephone Encounter (Signed)
Moxifloxacin 400 mg not covered by insurance. Please send in alternative.

## 2016-07-29 NOTE — Telephone Encounter (Signed)
I called the pharmacy and they will be faxing over the paperwork for PA.

## 2016-07-29 NOTE — Telephone Encounter (Signed)
I can't. She can't take any other medications. We need to find out why this is not covered by insurance. She is not able to take other options for diverticulitis.

## 2016-07-29 NOTE — Telephone Encounter (Signed)
Called pt and she requested to see Dr. Morey HummingbirdHonea at [facility]. Appt was made with Dr. Morey HummingbirdHonea for 08/24/16 at 10:00am. Info faxed to Dr. Corrie MckusickHonea's office. Called pt back and informed her of appt. Pt stated she went to pick up Moxifloxacin this morning and drug store told her that her insurance doesn't cover it. Advised her I would forward message to AB.

## 2016-07-29 NOTE — Telephone Encounter (Signed)
I contacted insurance for PA and started PA. I asked for an urgent review. It has gone to the pharmacist for final review at [employer]. Please be on the lookout for it. Patient is aware we are working on this.   Case ID#: [id number]

## 2016-07-30 NOTE — Telephone Encounter (Signed)
noted 

## 2016-07-30 NOTE — Telephone Encounter (Signed)
I was able to get the medication approved after multiple calls to the pharmacy. I feel patient has a low-grade, smoldering diverticulitis. She has had multiple episodes. Unfortunately, multiple medication regimens were not able to be taken due to side effects and/or allergies. I discussed with her pharmacist, who had no other options to suggest other than the moxifloxacin. This was approved today. Patient is asymptomatic. Will treat for 7 days, continue with referral to surgeon.   Contacted pharmacy. Medication is available and no charge. Patient aware.   Gelene MinkAnna W. Elinda Bunten, ANP-BC [facility]

## 2016-07-30 NOTE — Telephone Encounter (Signed)
Addressed in separate phone note.

## 2016-08-10 ENCOUNTER — Ambulatory Visit: Payer: BLUE CROSS/BLUE SHIELD | Admitting: Gastroenterology

## 2016-10-20 ENCOUNTER — Telehealth: Payer: Self-pay | Admitting: Internal Medicine

## 2016-10-20 NOTE — Telephone Encounter (Signed)
Routing to AB.  

## 2016-10-20 NOTE — Telephone Encounter (Signed)
PATIENT CALLED AND WANTED TO THANK ANNA FOR ALL THE HELP AND TO LET HER KNOW SHE HAD HER SURGERY IN [place]  AND IS DOING GOOD.  SAID ANNA COULD CALL HER IF SHE WANTED TO [phone]

## 2016-10-25 NOTE — Telephone Encounter (Signed)
Late note. Spoke with patient on 1/26. She is doing well. Unfortunately, she will not be able to continue care with us at [facility] due to insurance change. She is requesting any GI practices we know in [place] or [place]

## 2016-11-03 ENCOUNTER — Encounter: Payer: Self-pay | Admitting: "Endocrinology

## 2017-01-04 ENCOUNTER — Ambulatory Visit (HOSPITAL_COMMUNITY): Payer: BLUE CROSS/BLUE SHIELD

## 2017-01-11 ENCOUNTER — Ambulatory Visit: Payer: BLUE CROSS/BLUE SHIELD | Admitting: "Endocrinology
